# Patient Record
Sex: Male | Born: 1980 | Race: White | Hispanic: No | Marital: Married | Smoking: Never smoker
Health system: Southern US, Community
[De-identification: ages and names within clinical notes are randomized; demographics above are authoritative.]

## PROBLEM LIST (undated history)

## (undated) DIAGNOSIS — K219 Gastro-esophageal reflux disease without esophagitis: Secondary | ICD-10-CM

## (undated) HISTORY — DX: Gastro-esophageal reflux disease without esophagitis: K21.9

## (undated) HISTORY — PX: OTHER SURGICAL HISTORY: SHX169

## (undated) HISTORY — PX: UMBILECTOMY: SHX6675

---

## 2009-01-30 ENCOUNTER — Encounter: Admission: RE | Admit: 2009-01-30 | Discharge: 2009-01-30 | Payer: Self-pay | Admitting: Surgery

## 2009-03-07 ENCOUNTER — Ambulatory Visit (HOSPITAL_COMMUNITY): Admission: RE | Admit: 2009-03-07 | Discharge: 2009-03-07 | Payer: Self-pay | Admitting: Surgery

## 2010-03-03 ENCOUNTER — Encounter: Payer: Self-pay | Admitting: Surgery

## 2010-04-29 LAB — CBC
HCT: 44.6 % (ref 39.0–52.0)
MCHC: 34.5 g/dL (ref 30.0–36.0)
MCV: 86.9 fL (ref 78.0–100.0)
Platelets: 164 10*3/uL (ref 150–400)
WBC: 5.8 10*3/uL (ref 4.0–10.5)

## 2010-04-29 LAB — BASIC METABOLIC PANEL
CO2: 29 mEq/L (ref 19–32)
Calcium: 10 mg/dL (ref 8.4–10.5)
Chloride: 103 mEq/L (ref 96–112)
Creatinine, Ser: 1.02 mg/dL (ref 0.4–1.5)
Glucose, Bld: 81 mg/dL (ref 70–99)

## 2010-04-29 LAB — DIFFERENTIAL
Basophils Relative: 1 % (ref 0–1)
Eosinophils Absolute: 0.1 10*3/uL (ref 0.0–0.7)
Eosinophils Relative: 2 % (ref 0–5)
Lymphs Abs: 2.1 10*3/uL (ref 0.7–4.0)
Monocytes Relative: 11 % (ref 3–12)
Neutrophils Relative %: 51 % (ref 43–77)

## 2011-04-09 ENCOUNTER — Ambulatory Visit (INDEPENDENT_AMBULATORY_CARE_PROVIDER_SITE_OTHER): Payer: 59 | Admitting: Family Medicine

## 2011-04-09 ENCOUNTER — Encounter: Payer: Self-pay | Admitting: Family Medicine

## 2011-04-09 VITALS — BP 119/76 | HR 81 | Temp 99.2°F | Ht 71.0 in | Wt 222.0 lb

## 2011-04-09 DIAGNOSIS — M25519 Pain in unspecified shoulder: Secondary | ICD-10-CM

## 2011-04-09 DIAGNOSIS — M25511 Pain in right shoulder: Secondary | ICD-10-CM

## 2011-04-10 ENCOUNTER — Encounter: Payer: Self-pay | Admitting: Family Medicine

## 2011-04-10 DIAGNOSIS — M25511 Pain in right shoulder: Secondary | ICD-10-CM | POA: Insufficient documentation

## 2011-04-10 NOTE — Assessment & Plan Note (Signed)
overall his exam is reassuring.  Mild evidence of impingement and he's already gone through home exercise program.  We discussed conservative treatment with restarting HEP with nsaid, consider PT and/or cortisone injection.  He would like to go ahead with subacromial injection which was given today.  Advised to let me know if he wants to try PT.  Discussed icing, relative rest (especially next 5-7 days following injection).  F/u prn.  After informed written consent, patient was seated on exam table. Right shoulder was prepped with alcohol swab and utilizing posterior approach, patient's right subacromial space was injected with 3:1 marcaine: depomedrol. Patient tolerated the procedure well without immediate complications.

## 2011-04-10 NOTE — Progress Notes (Signed)
Subjective:    Patient ID: Angel Marshall, male    DOB: Jun 05, 1980, 31 y.o.   MRN: [id number]  PCP: Loleta Dicker  HPI 31 yo M here for right shoulder pain.  Patient initially started having problems with right shoulder about 8 months ago when playing basketball. Had right arm hit with it abducted, developed pain in right shoulder. This pain went away with time and home exercise program with theraband. Had negative apprehension, O'brien's tests at that time but positive impingement tests. Then reports over past couple months with throwing activities (is right handed) about 10 minutes in he develops pain in right shoulder. When he first throws or does overhead motion there's a rubber-band type feeling laterally in right shoulder that releases then no catching/popping after this. Recalls this restarting relatively soon after getting flu shot but does not feel this was given up near acromion. No night pain. No prior surgeries, injections of this shoulder. No left shoulder issues.  Past Medical History  Diagnosis Date  . GERD (gastroesophageal reflux disease)     No current outpatient prescriptions on file prior to visit.    History reviewed. No pertinent past surgical history.  No Known Allergies  History   Social History  . Marital Status: Married    Spouse Name: N/A    Number of Children: N/A  . Years of Education: N/A   Occupational History  . Not on file.   Social History Main Topics  . Smoking status: Never Smoker   . Smokeless tobacco: Not on file  . Alcohol Use: Not on file  . Drug Use: Not on file  . Sexually Active: Not on file   Other Topics Concern  . Not on file   Social History Narrative  . No narrative on file    Family History  Problem Relation Age of Onset  . Hyperlipidemia Mother   . Hyperlipidemia Father   . Diabetes Neg Hx   . Heart attack Neg Hx   . Hypertension Neg Hx   . Sudden death Neg Hx     BP 119/76  Pulse 81  Temp(Src) 99.2  F (37.3 C) (Oral)  Ht 5\' 11"  (1.803 m)  Wt 222 lb (100.699 kg)  BMI 30.96 kg/m2  Review of Systems See HPI above.    Objective:   Physical Exam Gen: NAD  R shoulder: No swelling, ecchymoses.  No gross deformity. No TTP AC or biceps tendon. FROM with mild painful arc. "Feels it" with hawkins but minimal pain.  Negative Neers. Negative Speeds, Yergasons. Negative crossover adduction. Negative o'briens. Negative Empty can and resisted internal/external rotation with 5/5 strength. Negative apprehension. NV intact distally.    Assessment & Plan:  1. Right shoulder pain - overall his exam is reassuring.  Mild evidence of impingement and he's already gone through home exercise program.  We discussed conservative treatment with restarting HEP with nsaid, consider PT and/or cortisone injection.  He would like to go ahead with subacromial injection which was given today.  Advised to let me know if he wants to try PT.  Discussed icing, relative rest (especially next 5-7 days following injection).  F/u prn.  Note: I do not think his receiving flu shot contributed to this based on where he states the injection site was - have seen some people with subacromial bursitis following flu shot but this occurred when flu shot site was just under acromion ([name] was not in this location).  After informed written consent, patient was  seated on exam table. Right shoulder was prepped with alcohol swab and utilizing posterior approach, patient's right subacromial space was injected with 3:1 marcaine: depomedrol. Patient tolerated the procedure well without immediate complications.

## 2011-12-17 ENCOUNTER — Ambulatory Visit (INDEPENDENT_AMBULATORY_CARE_PROVIDER_SITE_OTHER): Payer: 59 | Admitting: Family Medicine

## 2011-12-17 VITALS — BP 134/81 | Ht 71.0 in | Wt 207.0 lb

## 2011-12-17 DIAGNOSIS — M25519 Pain in unspecified shoulder: Secondary | ICD-10-CM

## 2011-12-17 DIAGNOSIS — M25511 Pain in right shoulder: Secondary | ICD-10-CM

## 2011-12-18 ENCOUNTER — Encounter: Payer: Self-pay | Admitting: Family Medicine

## 2011-12-18 NOTE — Assessment & Plan Note (Signed)
overall his exam is still reassuring.  However, not improving as expected with rest, home exercise program, subacromial injection.  No prior dislocation/subluxation.  Getting a click with throwing motions and pain with repetitive motions.  Advised we go ahead with MRI to further assess for rotator cuff pathology, possible labral pathology (less likely).  Will call him with results and how to proceed.

## 2011-12-18 NOTE — Progress Notes (Addendum)
Subjective:    Patient ID: Angel Marshall, male    DOB: [birth year], 31 y.o.   MRN: [id number]  PCP: Loleta Dicker  Shoulder Pain    31 yo M here for f/u right shoulder pain.  2/27: Patient initially started having problems with right shoulder about 8 months ago when playing basketball. Had right arm hit with it abducted, developed pain in right shoulder. This pain went away with time and home exercise program with theraband. Had negative apprehension, O'brien's tests at that time but positive impingement tests. Then reports over past couple months with throwing activities (is right handed) about 10 minutes in he develops pain in right shoulder. When he first throws or does overhead motion there's a rubber-band type feeling laterally in right shoulder that releases then no catching/popping after this. Recalls this restarting relatively soon after getting flu shot but does not feel this was given up near acromion. No night pain. No prior surgeries, injections of this shoulder. No left shoulder issues.  11/6: Patient reports despite rest, home exercise program, injection, has continued to struggle with right shoulder pain. Does improve with rest but when he goes to throw or cock arm back he feels a catching sensation deep in right shoulder. Pain comes on with repeated motions. Getting some pain in biceps tendon area as well. Improves with compression. 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 Improved with ergonomic issues. No radiation into arms. No numbness/tingling. No neck injuries. No bowel/bladder dysfunction.  Past Medical History  Diagnosis Date  . GERD (gastroesophageal reflux disease)     Current Outpatient Prescriptions on File Prior to Visit  Medication Sig Dispense Refill  . omeprazole (PRILOSEC) 10 MG capsule Take 10 mg by mouth daily.        History reviewed. No pertinent past surgical history.  No  Known Allergies  History   Social History  . Marital Status: Married    Spouse Name: N/A    Number of Children: N/A  . Years of Education: N/A   Occupational History  . Not on file.   Social History Main Topics  . Smoking status: Never Smoker   . Smokeless tobacco: Not on file  . Alcohol Use: Not on file  . Drug Use: Not on file  . Sexually Active: Not on file   Other Topics Concern  . Not on file   Social History Narrative  . No narrative on file    Family History  Problem Relation Age of Onset  . Hyperlipidemia Mother   . Hyperlipidemia Father   . Diabetes Neg Hx   . Heart attack Neg Hx   . Hypertension Neg Hx   . Sudden death Neg Hx     BP 134/81  Ht 5\' 11"  (1.803 m)  Wt 207 lb (93.895 kg)  BMI 28.87 kg/m2  Review of Systems  See HPI above.    Objective:   Physical Exam  Gen: NAD  Neck: No gross deformity, swelling, bruising. Mild TTP paraspinal muscles R > L.  No bony TTP. FROM neck - pain mild with extension and lateral rotations. BUE strength 5/5. Sensation intact to light touch.   2+ equal reflexes in biceps, trace triceps & brachioradialis tendons reflexes, equal bilaterally. Negative spurlings. NV intact distal BUEs.  R shoulder: No swelling, ecchymoses.  No gross deformity. TTP biceps tendon area.  No AC, other TTP. FROM with negative painful arc. "Feels it"  with hawkins but minimal pain.  Negative Neers. Negative Speeds, Yergasons. Negative crossover adduction. Negative o'briens. Negative Empty can and resisted internal/external rotation with 5/5 strength. Negative apprehension and sulcus. NV intact distally.    Assessment & Plan:  1. Right shoulder pain - overall his exam is still reassuring.  However, not improving as expected with rest, home exercise program, subacromial injection.  No prior dislocation/subluxation.  Getting a click with throwing motions and pain with repetitive motions.  Advised we go ahead with MRI to further  assess for rotator cuff pathology, possible labral pathology (less likely).  Will call him with results and how to proceed.  Addendum 1/8:  MRI results reviewed and discussed with patient.  Very reassuring - mild rotator cuff tendinopathy without tear and minimal subacromial bursitis.  Advised HEP, PT, and/or nitro patches.  He would like to continue with home exercise program for now.  Addendum 1/14:  Patient is going to speak with one of the PTs for a more extensive home exercise program, consider formal PT.  He also noted he went for a massage to right scapular, shoulder areas and this did help.  I think it's reasonable that he try massage as part of his treatment program over the next 2-3 months so will write for that as well.  Addendum 3/12:  Patient would like to try voltaren gel - script sent in to his pharmacy.

## 2011-12-20 ENCOUNTER — Ambulatory Visit (HOSPITAL_BASED_OUTPATIENT_CLINIC_OR_DEPARTMENT_OTHER): Payer: 59

## 2012-02-17 ENCOUNTER — Ambulatory Visit (HOSPITAL_BASED_OUTPATIENT_CLINIC_OR_DEPARTMENT_OTHER)
Admission: RE | Admit: 2012-02-17 | Discharge: 2012-02-17 | Disposition: A | Discharge status: Home / Self Care | Payer: 59 | Source: Ambulatory Visit | Attending: Family Medicine | Admitting: Family Medicine

## 2012-02-17 DIAGNOSIS — M25511 Pain in right shoulder: Secondary | ICD-10-CM

## 2012-02-17 DIAGNOSIS — M719 Bursopathy, unspecified: Secondary | ICD-10-CM | POA: Insufficient documentation

## 2012-02-17 DIAGNOSIS — M67919 Unspecified disorder of synovium and tendon, unspecified shoulder: Secondary | ICD-10-CM | POA: Insufficient documentation

## 2012-04-21 MED ORDER — DICLOFENAC SODIUM 1 % TD GEL: 2.0000 g | Freq: Four times a day (QID) | TRANSDERMAL | Status: DC

## 2012-04-21 NOTE — Addendum Note (Signed)
Addended by: Lenda Kelp on: 04/21/2012 08:44 AM   Modules accepted: Orders

## 2013-06-27 ENCOUNTER — Other Ambulatory Visit: Payer: Self-pay | Admitting: Family Medicine

## 2013-06-27 ENCOUNTER — Ambulatory Visit (INDEPENDENT_AMBULATORY_CARE_PROVIDER_SITE_OTHER): Payer: 59

## 2013-06-27 DIAGNOSIS — IMO0002 Reserved for concepts with insufficient information to code with codable children: Secondary | ICD-10-CM

## 2013-06-27 DIAGNOSIS — R229 Localized swelling, mass and lump, unspecified: Principal | ICD-10-CM

## 2013-06-27 DIAGNOSIS — N508 Other specified disorders of male genital organs: Secondary | ICD-10-CM

## 2014-02-09 ENCOUNTER — Telehealth: Payer: Self-pay | Admitting: Family Medicine

## 2014-02-09 NOTE — Telephone Encounter (Signed)
If this is the same issue with his shoulder, it's ok to just refer him to physical therapy.  If it's a different issue we should see him.

## 2014-02-17 ENCOUNTER — Other Ambulatory Visit: Payer: Self-pay | Admitting: *Deleted

## 2014-02-17 DIAGNOSIS — M25511 Pain in right shoulder: Secondary | ICD-10-CM

## 2014-02-27 ENCOUNTER — Ambulatory Visit: Payer: 59 | Admitting: Rehabilitation

## 2014-03-09 ENCOUNTER — Ambulatory Visit: Payer: 59 | Attending: Family Medicine | Admitting: Rehabilitation

## 2014-03-09 DIAGNOSIS — M25511 Pain in right shoulder: Secondary | ICD-10-CM | POA: Insufficient documentation

## 2014-03-20 ENCOUNTER — Ambulatory Visit: Payer: 59 | Attending: Family Medicine | Admitting: Rehabilitation

## 2014-03-20 DIAGNOSIS — M25511 Pain in right shoulder: Secondary | ICD-10-CM | POA: Diagnosis not present

## 2014-03-27 ENCOUNTER — Ambulatory Visit: Payer: 59 | Admitting: Rehabilitation

## 2014-04-03 ENCOUNTER — Encounter: Payer: Self-pay | Admitting: Rehabilitation

## 2014-04-03 ENCOUNTER — Ambulatory Visit: Payer: 59 | Admitting: Rehabilitation

## 2014-04-03 ENCOUNTER — Other Ambulatory Visit: Payer: Self-pay | Admitting: *Deleted

## 2014-04-03 DIAGNOSIS — M545 Low back pain, unspecified: Secondary | ICD-10-CM

## 2014-04-03 DIAGNOSIS — M25511 Pain in right shoulder: Secondary | ICD-10-CM | POA: Diagnosis not present

## 2014-04-03 NOTE — Patient Instructions (Signed)
HEP2go handout including: figure 4 stretch, piriformis stretch, ITB stretch with strap, ITB foam rolling, and open book rotations

## 2014-04-03 NOTE — Therapy (Signed)
[facility] [place] [address]  [place], [place], [address] Phone: [phone]   Fax:  [phone]  Physical Therapy Treatment  Patient Details  Name: Angel Marshall MRN: [id number] Date of Birth: 17-Nov-1980 Referring Provider:  Dene Gentry, MD  Encounter Date: 04/03/2014      PT End of Session - 04/03/14 1539    Visit Number 4   Number of Visits 6   Date for PT Re-Evaluation 04/20/14   PT Start Time 1400   PT Stop Time 1450   PT Time Calculation (min) 50 min      Past Medical History  Diagnosis Date  . GERD (gastroesophageal reflux disease)     History reviewed. No pertinent past surgical history.  There were no vitals taken for this visit.  Visit Diagnosis:  Left-sided low back pain without sciatica      Subjective Assessment - 04/03/14 1403    Symptoms Pt has no significant complaints of pain in the shoulder.  Is noticing more shoulder popping lately.  Would like to discuss low back and hamstring issues.  Pt has c/o intermittent low back spasms every 6-8 months.  Will occur with work during forward flexion and after getting fatigued after basketball.  Reports lately some feelings of L sided PSIS region. Feels this every day with motion. Does seem to be related to rotation during disc golf and after hamstrings are fatigued. Not really a pain   Pertinent History none   Diagnostic tests none   Patient Stated Goals pt would like to improve core stability if possible   Currently in Pain? Yes   Pain Score 2    Pain Location Other (Comment)  L PSIS region    Pain Frequency Intermittent   Aggravating Factors  rotation, hamstring fatigue   Pain Relieving Factors heat, foam roll, ice massage          [facility] PT Assessment - 04/03/14 0001    Assessment   Medical Diagnosis low back pain   Onset Date 02/11/11   Next MD Visit none   Prior Therapy no   Precautions   Precautions None   Restrictions    Weight Bearing Restrictions No   Balance Screen   Has the patient fallen in the past 6 months No   Prior Function   Level of Independence Independent with basic ADLs   Observation/Other Assessments   Focus on Therapeutic Outcomes (FOTO)  20%   AROM   Overall AROM Comments lumbar AROM WNL with tightness evident in lumbar rotation bilaterally   PROM   Overall PROM Comments PROM decreased KTOS, hip IR and hip ER bilaterally at >50%   Strength   Overall Strength Comments all LE strength testing normal. Extensor trunk strength excellent. trunk flexors at 46sec. B side planks normal   Flexibility   Soft Tissue Assessment /Muscle Length yes  decreased: KTOS positioning   Hamstrings 90 bilaterally   ITB moderately tight B   Piriformis significant tightness B   Palpation   Palpation no ttp; CPAs of lumbar spine normal   other   Comments SL stance, squat, and SL squat WNL, no change in pain with instability prone testing     Treatment:  Performed and demonstrated HEP:  Figure 4 stretch, R ITB stretch, R ITB with strap stretch R 2x20" all with vcs and tcs for performance.   Open book bilaterally 10"x2  PT Education - 04/03/14 1538    Education provided Yes   Education Details HEP   Person(s) Educated Patient   Methods Explanation;Demonstration;Handout   Comprehension Verbalized understanding;Returned demonstration             PT Long Term Goals - 04/03/14 1541    PT LONG TERM GOAL #1   Title be independent with advanced HEP for shoulder and core  3/10   Time 6   Period Weeks   Status Partially Met               Plan - 04/03/14 1539    Clinical Impression Statement Pt presents with symptoms consistent with lumbar pain and feelings of instability especially with lumbar rotation due to significant hip tightness in the internal and external rotators, ITB, and hip flexors, as well as the need for general core strength and endurance work.     PT Frequency 1x / week   PT Duration 6 weeks   PT Next Visit Plan core strengthening therapeutic exercise        Problem List Patient Active Problem List   Diagnosis Date Noted  . Right shoulder pain 04/10/2011    Tevis, Kara R, DPT 04/03/2014, 3:45 PM  [facility] [place] [address]  [address] [place], [place], [address] Phone: [phone]   Fax:  [phone]      

## 2014-04-19 ENCOUNTER — Ambulatory Visit: Payer: 59 | Admitting: Rehabilitation

## 2014-04-24 ENCOUNTER — Ambulatory Visit: Payer: 59 | Attending: Family Medicine | Admitting: Physical Therapy

## 2014-04-24 DIAGNOSIS — M25511 Pain in right shoulder: Secondary | ICD-10-CM | POA: Insufficient documentation

## 2014-04-24 DIAGNOSIS — M79631 Pain in right forearm: Secondary | ICD-10-CM

## 2014-04-24 NOTE — Therapy (Addendum)
[facility] [place] [address]  [place], [place], [address] Phone: [phone]   Fax:  [phone]  Physical Therapy Treatment  Patient Details  Name: Angel Marshall MRN: [id number] Date of Birth: 25-Dec-1980 Referring Provider:  Dene Gentry, MD  Encounter Date: 04/24/2014      PT End of Session - 04/24/14 1634    Visit Number 5   Number of Visits 6   Date for PT Re-Evaluation 05/01/14   PT Start Time 1450   PT Stop Time 1530   PT Time Calculation (min) 40 min      Past Medical History  Diagnosis Date  . GERD (gastroesophageal reflux disease)     No past surgical history on file.  There were no vitals filed for this visit.  Visit Diagnosis:  Right forearm pain      Subjective Assessment - 04/24/14 1455    Symptoms pt states LBP much better and stretching his hips has been helping.  States R shoulder also doing well but notes recent onset of R forearm pain.  States has had this in the past and required prolonged rest period to abolish this a couple years ago.   Pain Score --  1/10 at rest, 8/10 with disc golf and hammer curls   Pain Location --  R forearm in area of brachioradialus   Multiple Pain Sites No                   TODAY'S TREATMENT:      Trigger Point Dry Needling - 04/24/14 1640    Consent Given? Yes   Muscles Treated Upper Body --  R Brachioradialis and Extensor Digitorm    noted mild twitch response and reproduction of ache pain during treatment.  Iontophoresis patch with 63m/min Dex to R forearm.  Pt advised to remove at 1930 or earlier if uncomfortable.               PT Long Term Goals - 04/03/14 1541    PT LONG TERM GOAL #1   Title be independent with advanced HEP for shoulder and core  3/10   Time 6   Period Weeks   Status Partially Met               Plan - 04/24/14 1635    Clinical Impression Statement pt progressing well with LBP  POC.  Discussed stretching, yoga, and resistance training regarding LBP and pt with good understanding of progression at this point.  Pt with recent onset R forearm pain which seems muscle rather than tendon in origin and is area of bracihoradialis.  Treatment today focused on this area.   Pt will benefit from skilled therapeutic intervention in order to improve on the following deficits Pain   Rehab Potential Good   PT Treatment/Interventions Dry needling;Ultrasound;Therapeutic activities;Therapeutic exercise;Patient/family education;Moist Heat;Manual techniques;Electrical Stimulation;Other (comment)  iontophoresis with 871mmin dex   PT Next Visit Plan re-assess, possible tape and/or ionto   Consulted and Agree with Plan of Care Patient        Problem List Patient Active Problem List   Diagnosis Date Noted  . Right shoulder pain 04/10/2011    [facility], OCS 04/24/2014, 4:46 PM  [facility]one: [phone] Fax:  [phone]   PHYSICAL THERAPY DISCHARGE SUMMARY  Visits from Start of Care: 5  Current  functional level related to goals / functional outcomes: Nearly full to full function   Remaining deficits: unknown   Education / Equipment: HEP and Dietitian Plan: Patient agrees to discharge.  Patient goals were partially met. Patient is being discharged due to not returning since the last visit.  ?????     Angel Marshall was seen due to LBP and was also treated briefly for R Shoulder and Forearm pain.  Overall seemed to progress well and did not require much assistance once his back improved.  He was consistent with HEP and was able to return to prior gym workouts and disc golf.  He is being discharged due to not returning since last treatment.  [name] PT, OCS 06/13/2014 2:57 PM

## 2015-02-26 ENCOUNTER — Other Ambulatory Visit: Payer: Self-pay | Admitting: Emergency Medicine

## 2015-02-26 DIAGNOSIS — E291 Testicular hypofunction: Secondary | ICD-10-CM

## 2015-03-02 ENCOUNTER — Other Ambulatory Visit (INDEPENDENT_AMBULATORY_CARE_PROVIDER_SITE_OTHER): Payer: 59

## 2015-03-02 DIAGNOSIS — E291 Testicular hypofunction: Secondary | ICD-10-CM | POA: Diagnosis not present

## 2015-03-02 LAB — TESTOSTERONE: TESTOSTERONE: 420 ng/dL (ref 250–827)

## 2015-03-09 LAB — ESTRADIOL, FREE
ESTRADIOL FREE: 0.5 pg/mL — AB (ref <=0.45)
Estradiol: 26 pg/mL (ref <=29)

## 2015-05-24 ENCOUNTER — Other Ambulatory Visit: Payer: Self-pay | Admitting: Family Medicine

## 2015-05-24 ENCOUNTER — Other Ambulatory Visit (INDEPENDENT_AMBULATORY_CARE_PROVIDER_SITE_OTHER): Payer: 59

## 2015-05-24 DIAGNOSIS — E291 Testicular hypofunction: Secondary | ICD-10-CM

## 2015-05-25 LAB — ESTRADIOL: ESTRADIOL: 43 pg/mL — AB (ref <=39)

## 2015-05-29 LAB — TESTOS,TOTAL,FREE AND SHBG (FEMALE)
SEX HORMONE BINDING GLOB.: 35 nmol/L (ref 10–50)
TESTOSTERONE,FREE: 121.9 pg/mL (ref 35.0–155.0)
Testosterone,Total,LC/MS/MS: 739 ng/dL (ref 250–1100)

## 2015-07-18 ENCOUNTER — Encounter: Payer: Self-pay | Admitting: Family Medicine

## 2015-07-18 ENCOUNTER — Ambulatory Visit (INDEPENDENT_AMBULATORY_CARE_PROVIDER_SITE_OTHER): Payer: 59 | Admitting: Family Medicine

## 2015-07-18 VITALS — BP 127/81 | HR 71 | Temp 98.0°F | Ht 70.0 in | Wt 198.2 lb

## 2015-07-18 DIAGNOSIS — L03012 Cellulitis of left finger: Secondary | ICD-10-CM

## 2015-07-18 MED ORDER — AMOXICILLIN-POT CLAVULANATE 875-125 MG PO TABS: 1.0000 | ORAL_TABLET | Freq: Two times a day (BID) | ORAL | Status: DC

## 2015-07-18 NOTE — Progress Notes (Signed)
Pre visit review using our clinic review tool, if applicable. No additional management support is needed unless otherwise documented below in the visit note. 

## 2015-07-18 NOTE — Progress Notes (Signed)
[place] [facility] [address], [place],  [address] [phone]  Date:  07/18/2015   Name:  Angel Marshall   DOB:  [birth year]   MRN:  [id number]  PCP:  Amador Cunas, FNP    Chief Complaint: Swollen Finger   History of Present Illness:  Angel Marshall is a 35 y.o. 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  He noted possible infection at the base of the nail about 3 days ago; thought it might have some from a hang-nail.  Yesterday he tried to lance it and did not get any pus, tried again today but only got some clear fluid and a little bit of blood.  The nail is quite tender He has otherwise felt well, no fever or chills He is generally in good health   Patient Active Problem List   Diagnosis Date Noted  . Right shoulder pain 04/10/2011    Past Medical History  Diagnosis Date  . GERD (gastroesophageal reflux disease)     No past surgical history on file.  Social History  Substance Use Topics  . Smoking status: Never Smoker   . Smokeless tobacco: None  . Alcohol Use: None    Family History  Problem Relation Age of Onset  . Hyperlipidemia Mother   . Hyperlipidemia Father   . Diabetes Neg Hx   . Heart attack Neg Hx   . Hypertension Neg Hx   . Sudden death Neg Hx     No Known Allergies  Medication list has been reviewed and updated.  No current outpatient prescriptions on file prior to visit.   No current facility-administered medications on file prior to visit.    Review of Systems:  As per HPI- otherwise negative.   Physical Examination: There were no vitals filed for this visit. Filed Vitals:   07/18/15 1354  Height: 5\' 10"  (1.778 m)  Weight: 198 lb 3.2 oz (89.903 kg)   Body mass index is 28.44 kg/(m^2). Ideal Body Weight: Weight in (lb) to have BMI = 25:  173.9   GEN: WDWN, NAD, Non-toxic, Alert & Oriented x 3 HEENT: Atraumatic, Normocephalic.  Ears and Nose: No external deformity. EXTR: No clubbing/cyanosis/edema NEURO: Normal gait.  PSYCH: Normally interactive. Conversant. Not depressed or anxious appearing.  Calm demeanor.  Left index finger: there is a red, swollen area with an area of maceration at the base of the nail.   VC obtained; prepped finger and used a small wheal of lidocaine for anesthesia. I andD with 11 blade and evacuated a small hematoma but no pus  Assessment and Plan: Paronychia of finger, left - Plan: amoxicillin-clavulanate (AUGMENTIN) 875-125 MG tablet  Here today with paronychia- I and D relieved hematoma but not pus. Will start on augmentin.  He will let me know if not improved in the next few days- Sooner if worse.      Signed Lamar Blinks, MD

## 2015-07-18 NOTE — Patient Instructions (Signed)
Use the augmentin as directed for your infected finger.  Please let me know if it is not getting better in the next couple of days-.Sooner if worse.   You can soak the finger and/ or apply neosporin if you like.   Send me a staff if not improving!

## 2015-08-03 ENCOUNTER — Other Ambulatory Visit: Payer: Self-pay | Admitting: Family Medicine

## 2015-08-03 ENCOUNTER — Other Ambulatory Visit (INDEPENDENT_AMBULATORY_CARE_PROVIDER_SITE_OTHER): Payer: 59

## 2015-08-03 DIAGNOSIS — M609 Myositis, unspecified: Secondary | ICD-10-CM

## 2015-08-03 DIAGNOSIS — R0602 Shortness of breath: Secondary | ICD-10-CM

## 2015-08-03 DIAGNOSIS — IMO0001 Reserved for inherently not codable concepts without codable children: Secondary | ICD-10-CM

## 2015-08-03 DIAGNOSIS — E349 Endocrine disorder, unspecified: Secondary | ICD-10-CM

## 2015-08-03 DIAGNOSIS — R5383 Other fatigue: Secondary | ICD-10-CM | POA: Diagnosis not present

## 2015-08-03 DIAGNOSIS — K219 Gastro-esophageal reflux disease without esophagitis: Secondary | ICD-10-CM | POA: Diagnosis not present

## 2015-08-03 DIAGNOSIS — R12 Heartburn: Secondary | ICD-10-CM

## 2015-08-03 DIAGNOSIS — E348 Other specified endocrine disorders: Secondary | ICD-10-CM | POA: Diagnosis not present

## 2015-08-03 DIAGNOSIS — M791 Myalgia: Secondary | ICD-10-CM | POA: Diagnosis not present

## 2015-08-03 LAB — COMPREHENSIVE METABOLIC PANEL
ALBUMIN: 5 g/dL (ref 3.5–5.2)
ALK PHOS: 54 U/L (ref 39–117)
ALT: 36 U/L (ref 0–53)
AST: 29 U/L (ref 0–37)
BUN: 18 mg/dL (ref 6–23)
CHLORIDE: 102 meq/L (ref 96–112)
CO2: 30 mEq/L (ref 19–32)
CREATININE: 1.14 mg/dL (ref 0.40–1.50)
Calcium: 10.4 mg/dL (ref 8.4–10.5)
GFR: 77.43 mL/min (ref >60.00)
GLUCOSE: 85 mg/dL (ref 70–99)
POTASSIUM: 3.8 meq/L (ref 3.5–5.1)
SODIUM: 138 meq/L (ref 135–145)
TOTAL PROTEIN: 8 g/dL (ref 6.0–8.3)
Total Bilirubin: 0.8 mg/dL (ref 0.2–1.2)

## 2015-08-03 LAB — LIPID PANEL
CHOLESTEROL: 252 mg/dL — AB (ref 0–200)
HDL: 54.9 mg/dL (ref >39.00)
LDL Cholesterol: 185 mg/dL — ABNORMAL HIGH (ref 0–99)
NONHDL: 196.81
TRIGLYCERIDES: 60 mg/dL (ref 0.0–149.0)
Total CHOL/HDL Ratio: 5
VLDL: 12 mg/dL (ref 0.0–40.0)

## 2015-08-03 LAB — MAGNESIUM: MAGNESIUM: 2 mg/dL (ref 1.5–2.5)

## 2015-08-03 LAB — VITAMIN D 25 HYDROXY (VIT D DEFICIENCY, FRACTURES): VITD: 78.76 ng/mL (ref 30.00–100.00)

## 2015-08-03 LAB — SEDIMENTATION RATE: Sed Rate: 3 mm/hr (ref 0–15)

## 2015-08-03 LAB — HEMOGLOBIN A1C: Hgb A1c MFr Bld: 5.3 % (ref 4.6–6.5)

## 2015-08-03 LAB — TSH: TSH: 2.14 u[IU]/mL (ref 0.35–4.50)

## 2015-08-03 NOTE — Addendum Note (Signed)
Addended by: Caffie Pinto on: 08/03/2015 10:47 AM   Modules accepted: Orders

## 2015-08-06 ENCOUNTER — Encounter: Payer: Self-pay | Admitting: Family Medicine

## 2015-08-06 ENCOUNTER — Ambulatory Visit (INDEPENDENT_AMBULATORY_CARE_PROVIDER_SITE_OTHER): Payer: 59 | Admitting: Family Medicine

## 2015-08-06 VITALS — BP 123/84 | HR 83 | Ht 70.0 in

## 2015-08-06 DIAGNOSIS — M25562 Pain in left knee: Secondary | ICD-10-CM | POA: Diagnosis not present

## 2015-08-06 LAB — CYCLIC CITRUL PEPTIDE ANTIBODY, IGG: Cyclic Citrullin Peptide Ab: <16 Units

## 2015-08-06 NOTE — Patient Instructions (Signed)
You have a traumatic synovitis. Ice 15 minutes at a time 3-4 times a day while swollen. Elevate above your heart as much as possible. Ibuprofen or aleve for 7-10 days then as needed. ACE wrap when up and walking around for 1-2 weeks or until swelling has gone away. Basic knee extensions, straight leg raises 3 sets of 10 once a day. Let me know how you're doing over next 1-2 weeks.

## 2015-08-08 DIAGNOSIS — M25562 Pain in left knee: Secondary | ICD-10-CM | POA: Insufficient documentation

## 2015-08-08 LAB — TESTOS,TOTAL,FREE AND SHBG (FEMALE)
Sex Hormone Binding Glob.: 41 nmol/L (ref 10–50)
Testosterone, Free: 42.5 pg/mL (ref 35.0–155.0)
Testosterone,Total,LC/MS/MS: 283 ng/dL (ref 250–1100)

## 2015-08-08 NOTE — Progress Notes (Signed)
PCP: Amador Cunas, FNP  Subjective:   HPI: Patient is a 35 y.o. male here for left knee pain.  Patient reports on Saturday he was playing in a [employer]. No injuries during this. Recalls turning to get out of a car and felt weird in his left knee. Tightness, swelling, throbbing especially later that night. Tightness into his calf also. Pain has improved a lot over past 24 hours - now at most 2/10, dull and anterior. Still swollen. No prior knee issues. No numbness, skin changes.  Past Medical History  Diagnosis Date  . GERD (gastroesophageal reflux disease)     Current Outpatient Prescriptions on File Prior to Visit  Medication Sig Dispense Refill  . amoxicillin-clavulanate (AUGMENTIN) 875-125 MG tablet Take 1 tablet by mouth 2 (two) times daily. 20 tablet 0  . Cholecalciferol (VITAMIN D3) 5000 units CAPS Take 1 capsule by mouth daily.    . meloxicam (MOBIC) 15 MG tablet Take 1 tablet by mouth daily as needed.  0  . Multiple Vitamin (MULTIVITAMIN) tablet Take 1 tablet by mouth daily.    . naproxen (NAPROSYN) 500 MG tablet Take 1 tablet by mouth 2 (two) times daily.  0  . ranitidine (ZANTAC) 150 MG capsule Take 150 mg by mouth 2 (two) times daily.    . SUDOGEST 30 MG tablet Take 1-2 tablets every 4-6 hours as needed  0   No current facility-administered medications on file prior to visit.    No past surgical history on file.  No Known Allergies  Social History   Social History  . Marital Status: Married    Spouse Name: N/A  . Number of Children: N/A  . Years of Education: N/A   Occupational History  . Not on file.   Social History Main Topics  . Smoking status: Never Smoker   . Smokeless tobacco: Not on file  . Alcohol Use: Not on file  . Drug Use: Not on file  . Sexual Activity: Not on file   Other Topics Concern  . Not on file   Social History Narrative    Family History  Problem Relation Age of Onset  . Hyperlipidemia Mother   . Hyperlipidemia  Father   . Diabetes Neg Hx   . Heart attack Neg Hx   . Hypertension Neg Hx   . Sudden death Neg Hx     BP 123/84 mmHg  Pulse 83  Ht 5\' 10"  (1.778 m)  Review of Systems: See HPI above.    Objective:  Physical Exam:  Gen: NAD, comfortable in exam room  Left knee: Mod effusion.  No bruising, other deformity. No TTP. FROM. Negative ant/post drawers. Negative valgus/varus testing. Negative lachmanns. Negative mcmurrays, apleys, thessalys, sit home, patellar apprehension. NV intact distally.    Assessment & Plan:  1. Left knee pain - most of pain has resolved and his exam is completely benign with the exception of effusion.  No acute injury.  Consistent with a traumatic synovitis.  Icing, elevation, compression, nsaids for next 7-10 days.  Shown home exercises to do daily.  Advised to let us know how he's doing over next 1-2 weeks.

## 2015-08-08 NOTE — Assessment & Plan Note (Signed)
most of pain has resolved and his exam is completely benign with the exception of effusion.  No acute injury.  Consistent with a traumatic synovitis.  Icing, elevation, compression, nsaids for next 7-10 days.  Shown home exercises to do daily.  Advised to let us know how he's doing over next 1-2 weeks.

## 2015-09-11 HISTORY — PX: APPENDECTOMY: SHX54

## 2015-09-24 ENCOUNTER — Observation Stay (HOSPITAL_COMMUNITY)
Admission: EM | Admit: 2015-09-24 | Discharge: 2015-09-26 | Disposition: A | Discharge status: Home / Self Care | Payer: 59 | Source: Home / Self Care | Attending: Emergency Medicine | Admitting: Emergency Medicine

## 2015-09-24 ENCOUNTER — Ambulatory Visit (HOSPITAL_BASED_OUTPATIENT_CLINIC_OR_DEPARTMENT_OTHER)
Admission: RE | Admit: 2015-09-24 | Discharge: 2015-09-24 | Disposition: A | Discharge status: Home / Self Care | Payer: 59 | Source: Ambulatory Visit | Attending: Sports Medicine | Admitting: Sports Medicine

## 2015-09-24 ENCOUNTER — Encounter (HOSPITAL_COMMUNITY): Payer: Self-pay | Admitting: Emergency Medicine

## 2015-09-24 ENCOUNTER — Other Ambulatory Visit: Payer: Self-pay | Admitting: Sports Medicine

## 2015-09-24 DIAGNOSIS — K353 Acute appendicitis with localized peritonitis, without perforation or gangrene: Secondary | ICD-10-CM

## 2015-09-24 DIAGNOSIS — R100 Acute abdomen: Secondary | ICD-10-CM | POA: Insufficient documentation

## 2015-09-24 DIAGNOSIS — R109 Unspecified abdominal pain: Secondary | ICD-10-CM

## 2015-09-24 DIAGNOSIS — K3580 Unspecified acute appendicitis: Secondary | ICD-10-CM | POA: Insufficient documentation

## 2015-09-24 DIAGNOSIS — Z9049 Acquired absence of other specified parts of digestive tract: Secondary | ICD-10-CM | POA: Diagnosis present

## 2015-09-24 LAB — CBC WITH DIFFERENTIAL/PLATELET
Basophils Absolute: 0 10*3/uL (ref 0.0–0.1)
Basophils Relative: 0 %
EOS ABS: 0.1 10*3/uL (ref 0.0–0.7)
EOS PCT: 1 %
HCT: 44.9 % (ref 39.0–52.0)
Hemoglobin: 14.8 g/dL (ref 13.0–17.0)
LYMPHS ABS: 1.9 10*3/uL (ref 0.7–4.0)
LYMPHS PCT: 13 %
MCH: 29.4 pg (ref 26.0–34.0)
MCHC: 33 g/dL (ref 30.0–36.0)
MCV: 89.3 fL (ref 78.0–100.0)
MONO ABS: 1.1 10*3/uL — AB (ref 0.1–1.0)
MONOS PCT: 7 %
Neutro Abs: 11.8 10*3/uL — ABNORMAL HIGH (ref 1.7–7.7)
Neutrophils Relative %: 79 %
PLATELETS: 198 10*3/uL (ref 150–400)
RBC: 5.03 MIL/uL (ref 4.22–5.81)
RDW: 13.1 % (ref 11.5–15.5)
WBC: 14.9 10*3/uL — AB (ref 4.0–10.5)

## 2015-09-24 LAB — URINALYSIS, ROUTINE W REFLEX MICROSCOPIC
BILIRUBIN URINE: NEGATIVE
GLUCOSE, UA: NEGATIVE mg/dL
HGB URINE DIPSTICK: NEGATIVE
Ketones, ur: 40 mg/dL — AB
Leukocytes, UA: NEGATIVE
Nitrite: NEGATIVE
PROTEIN: NEGATIVE mg/dL
Specific Gravity, Urine: 1.03 (ref 1.005–1.030)
pH: 7 (ref 5.0–8.0)

## 2015-09-24 MED ORDER — IOPAMIDOL (ISOVUE-300) INJECTION 61%
100.0000 mL | Freq: Once | INTRAVENOUS | Status: AC | PRN
  Administered 2015-09-24: 100 mL via INTRAVENOUS

## 2015-09-24 MED ORDER — IIOPAMIDOL (ISOVUE-250) INJECTION 51%: 100.0000 mL | Freq: Once | INTRAVENOUS | Status: DC | PRN

## 2015-09-24 NOTE — Progress Notes (Signed)
Patient with acute abdominal pain, RLQ, needs urgent CT to eval for appendicitis. Will follow up with PCP prn.

## 2015-09-24 NOTE — ED Triage Notes (Signed)
Charge nurse notified on pt.'s arrival / condition .

## 2015-09-24 NOTE — ED Triage Notes (Signed)
Pt. reports RLQ pain onset this morning , denies emesis or diarrhea , no fever or chills , seen at [facility] today -CT scan result shows acute appendicitis , transferred here for admission/surgery .

## 2015-09-25 ENCOUNTER — Observation Stay (HOSPITAL_COMMUNITY): Payer: 59 | Admitting: Anesthesiology

## 2015-09-25 ENCOUNTER — Encounter (HOSPITAL_COMMUNITY)
Admission: EM | Disposition: A | Discharge status: Home / Self Care | Payer: Self-pay | Source: Home / Self Care | Attending: Emergency Medicine

## 2015-09-25 ENCOUNTER — Encounter (HOSPITAL_COMMUNITY): Payer: Self-pay | Admitting: Neurology

## 2015-09-25 DIAGNOSIS — K219 Gastro-esophageal reflux disease without esophagitis: Secondary | ICD-10-CM | POA: Diagnosis not present

## 2015-09-25 DIAGNOSIS — Z9049 Acquired absence of other specified parts of digestive tract: Secondary | ICD-10-CM | POA: Diagnosis present

## 2015-09-25 DIAGNOSIS — K3580 Unspecified acute appendicitis: Secondary | ICD-10-CM | POA: Diagnosis not present

## 2015-09-25 DIAGNOSIS — R1031 Right lower quadrant pain: Secondary | ICD-10-CM | POA: Diagnosis not present

## 2015-09-25 DIAGNOSIS — R100 Acute abdomen: Secondary | ICD-10-CM | POA: Diagnosis not present

## 2015-09-25 DIAGNOSIS — K37 Unspecified appendicitis: Secondary | ICD-10-CM | POA: Diagnosis not present

## 2015-09-25 HISTORY — PX: LAPAROSCOPIC APPENDECTOMY: SHX408

## 2015-09-25 LAB — COMPREHENSIVE METABOLIC PANEL
ALT: 28 U/L (ref 17–63)
AST: 30 U/L (ref 15–41)
Albumin: 4.3 g/dL (ref 3.5–5.0)
Alkaline Phosphatase: 49 U/L (ref 38–126)
Anion gap: 8 (ref 5–15)
BILIRUBIN TOTAL: 1 mg/dL (ref 0.3–1.2)
BUN: 9 mg/dL (ref 6–20)
CHLORIDE: 102 mmol/L (ref 101–111)
CO2: 28 mmol/L (ref 22–32)
Calcium: 9.7 mg/dL (ref 8.9–10.3)
Creatinine, Ser: 1.18 mg/dL (ref 0.61–1.24)
Glucose, Bld: 107 mg/dL — ABNORMAL HIGH (ref 65–99)
POTASSIUM: 3.8 mmol/L (ref 3.5–5.1)
Sodium: 138 mmol/L (ref 135–145)
TOTAL PROTEIN: 7 g/dL (ref 6.5–8.1)

## 2015-09-25 LAB — SURGICAL PCR SCREEN
MRSA, PCR: NEGATIVE
STAPHYLOCOCCUS AUREUS: NEGATIVE

## 2015-09-25 SURGERY — APPENDECTOMY, LAPAROSCOPIC
Anesthesia: General | Site: Abdomen

## 2015-09-25 MED ORDER — LIDOCAINE 2% (20 MG/ML) 5 ML SYRINGE
INTRAMUSCULAR | Status: AC
  Filled 2015-09-25: qty 5

## 2015-09-25 MED ORDER — CHLORHEXIDINE GLUCONATE CLOTH 2 % EX PADS: 6.0000 | MEDICATED_PAD | Freq: Once | CUTANEOUS | Status: DC

## 2015-09-25 MED ORDER — 0.9 % SODIUM CHLORIDE (POUR BTL) OPTIME
TOPICAL | Status: DC | PRN
  Administered 2015-09-25: 1000 mL

## 2015-09-25 MED ORDER — FENTANYL CITRATE (PF) 250 MCG/5ML IJ SOLN
INTRAMUSCULAR | Status: AC
  Filled 2015-09-25: qty 5

## 2015-09-25 MED ORDER — HYDROMORPHONE HCL 1 MG/ML IJ SOLN: 1.0000 mg | INTRAMUSCULAR | Status: DC | PRN

## 2015-09-25 MED ORDER — SODIUM CHLORIDE 0.9 % IR SOLN
Status: DC | PRN
  Administered 2015-09-25: 1000 mL

## 2015-09-25 MED ORDER — SUGAMMADEX SODIUM 200 MG/2ML IV SOLN
INTRAVENOUS | Status: DC | PRN
  Administered 2015-09-25: 200 mg via INTRAVENOUS

## 2015-09-25 MED ORDER — HYDROMORPHONE HCL 1 MG/ML IJ SOLN: 0.2500 mg | INTRAMUSCULAR | Status: DC | PRN

## 2015-09-25 MED ORDER — PROPOFOL 10 MG/ML IV BOLUS
INTRAVENOUS | Status: DC | PRN
  Administered 2015-09-25: 200 mg via INTRAVENOUS

## 2015-09-25 MED ORDER — METRONIDAZOLE IN NACL 5-0.79 MG/ML-% IV SOLN
500.0000 mg | Freq: Three times a day (TID) | INTRAVENOUS | Status: DC
Start: 2015-09-25 — End: 2015-09-26
  Administered 2015-09-25 – 2015-09-26 (×5): 500 mg via INTRAVENOUS
  Filled 2015-09-25 (×7): qty 100

## 2015-09-25 MED ORDER — SUCCINYLCHOLINE CHLORIDE 200 MG/10ML IV SOSY
PREFILLED_SYRINGE | INTRAVENOUS | Status: AC
  Filled 2015-09-25: qty 10

## 2015-09-25 MED ORDER — LIDOCAINE HCL (CARDIAC) 20 MG/ML IV SOLN
INTRAVENOUS | Status: DC | PRN
  Administered 2015-09-25: 90 mg via INTRAVENOUS

## 2015-09-25 MED ORDER — PROMETHAZINE HCL 25 MG/ML IJ SOLN: 6.2500 mg | INTRAMUSCULAR | Status: DC | PRN

## 2015-09-25 MED ORDER — FENTANYL CITRATE (PF) 100 MCG/2ML IJ SOLN
INTRAMUSCULAR | Status: DC | PRN
  Administered 2015-09-25: 50 ug via INTRAVENOUS

## 2015-09-25 MED ORDER — OXYCODONE HCL 5 MG/5ML PO SOLN: 5.0000 mg | Freq: Once | ORAL | Status: AC | PRN

## 2015-09-25 MED ORDER — PHENYLEPHRINE 40 MCG/ML (10ML) SYRINGE FOR IV PUSH (FOR BLOOD PRESSURE SUPPORT)
PREFILLED_SYRINGE | INTRAVENOUS | Status: AC
  Filled 2015-09-25: qty 10

## 2015-09-25 MED ORDER — OXYCODONE HCL 5 MG PO TABS
ORAL_TABLET | ORAL | Status: AC
  Filled 2015-09-25: qty 1

## 2015-09-25 MED ORDER — BUPIVACAINE-EPINEPHRINE 0.5% -1:200000 IJ SOLN
INTRAMUSCULAR | Status: DC | PRN
  Administered 2015-09-25: 3 mL

## 2015-09-25 MED ORDER — ONDANSETRON 4 MG PO TBDP: 4.0000 mg | ORAL_TABLET | Freq: Four times a day (QID) | ORAL | Status: DC | PRN

## 2015-09-25 MED ORDER — DEXTROSE 5 % IV SOLN
2.0000 g | Freq: Every day | INTRAVENOUS | Status: DC
  Administered 2015-09-25 – 2015-09-26 (×2): 2 g via INTRAVENOUS
  Filled 2015-09-25 (×2): qty 2

## 2015-09-25 MED ORDER — PROPOFOL 10 MG/ML IV BOLUS
INTRAVENOUS | Status: AC
  Filled 2015-09-25: qty 20

## 2015-09-25 MED ORDER — SUGAMMADEX SODIUM 200 MG/2ML IV SOLN
INTRAVENOUS | Status: AC
  Filled 2015-09-25: qty 2

## 2015-09-25 MED ORDER — PHENYLEPHRINE HCL 10 MG/ML IJ SOLN
INTRAMUSCULAR | Status: DC | PRN
  Administered 2015-09-25 (×2): 80 ug via INTRAVENOUS

## 2015-09-25 MED ORDER — OXYCODONE-ACETAMINOPHEN 5-325 MG PO TABS: 1.0000 | ORAL_TABLET | ORAL | Status: DC | PRN

## 2015-09-25 MED ORDER — LACTATED RINGERS IV SOLN
INTRAVENOUS | Status: DC | PRN
  Administered 2015-09-25 (×2): via INTRAVENOUS

## 2015-09-25 MED ORDER — SUCCINYLCHOLINE CHLORIDE 20 MG/ML IJ SOLN
INTRAMUSCULAR | Status: DC | PRN
  Administered 2015-09-25: 90 mg via INTRAVENOUS

## 2015-09-25 MED ORDER — ONDANSETRON HCL 4 MG/2ML IJ SOLN: 4.0000 mg | Freq: Four times a day (QID) | INTRAMUSCULAR | Status: DC | PRN

## 2015-09-25 MED ORDER — BUPIVACAINE-EPINEPHRINE (PF) 0.5% -1:200000 IJ SOLN
INTRAMUSCULAR | Status: AC
  Filled 2015-09-25: qty 30

## 2015-09-25 MED ORDER — ROCURONIUM BROMIDE 100 MG/10ML IV SOLN
INTRAVENOUS | Status: DC | PRN
  Administered 2015-09-25: 30 mg via INTRAVENOUS
  Administered 2015-09-25: 10 mg via INTRAVENOUS

## 2015-09-25 MED ORDER — ONDANSETRON HCL 4 MG/2ML IJ SOLN
INTRAMUSCULAR | Status: DC | PRN
  Administered 2015-09-25: 4 mg via INTRAVENOUS

## 2015-09-25 MED ORDER — MIDAZOLAM HCL 2 MG/2ML IJ SOLN
INTRAMUSCULAR | Status: AC
  Filled 2015-09-25: qty 2

## 2015-09-25 MED ORDER — DEXTROSE-NACL 5-0.9 % IV SOLN
INTRAVENOUS | Status: DC
  Administered 2015-09-25 – 2015-09-26 (×3): via INTRAVENOUS

## 2015-09-25 MED ORDER — ROCURONIUM BROMIDE 10 MG/ML (PF) SYRINGE
PREFILLED_SYRINGE | INTRAVENOUS | Status: AC
  Filled 2015-09-25: qty 10

## 2015-09-25 MED ORDER — OXYCODONE HCL 5 MG PO TABS
5.0000 mg | ORAL_TABLET | Freq: Once | ORAL | Status: AC | PRN
  Administered 2015-09-25: 5 mg via ORAL

## 2015-09-25 MED ORDER — PHENYLEPHRINE HCL 10 MG/ML IJ SOLN
INTRAVENOUS | Status: DC | PRN
  Administered 2015-09-25: 50 ug/min via INTRAVENOUS

## 2015-09-25 MED ORDER — ONDANSETRON HCL 4 MG/2ML IJ SOLN
INTRAMUSCULAR | Status: AC
  Filled 2015-09-25: qty 2

## 2015-09-25 SURGICAL SUPPLY — 44 items
APPLIER CLIP ROT 10 11.4 M/L (STAPLE) ×3
BLADE SURG ROTATE 9660 (MISCELLANEOUS) IMPLANT
CANISTER SUCTION 2500CC (MISCELLANEOUS) ×3 IMPLANT
CHLORAPREP W/TINT 26ML (MISCELLANEOUS) ×3 IMPLANT
CLIP APPLIE ROT 10 11.4 M/L (STAPLE) ×1 IMPLANT
COVER SURGICAL LIGHT HANDLE (MISCELLANEOUS) ×3 IMPLANT
CUTTER FLEX LINEAR 45M (STAPLE) ×3 IMPLANT
DRAPE WARM FLUID 44X44 (DRAPE) ×3 IMPLANT
ELECT REM PT RETURN 9FT ADLT (ELECTROSURGICAL) ×3
ELECTRODE REM PT RTRN 9FT ADLT (ELECTROSURGICAL) ×1 IMPLANT
ENDOLOOP SUT PDS II  0 18 (SUTURE)
ENDOLOOP SUT PDS II 0 18 (SUTURE) IMPLANT
GLOVE BIO SURGEON STRL SZ8 (GLOVE) ×3 IMPLANT
GLOVE BIOGEL PI IND STRL 6.5 (GLOVE) ×1 IMPLANT
GLOVE BIOGEL PI IND STRL 7.0 (GLOVE) ×1 IMPLANT
GLOVE BIOGEL PI IND STRL 8 (GLOVE) ×1 IMPLANT
GLOVE BIOGEL PI INDICATOR 6.5 (GLOVE) ×2
GLOVE BIOGEL PI INDICATOR 7.0 (GLOVE) ×2
GLOVE BIOGEL PI INDICATOR 8 (GLOVE) ×2
GLOVE SURG SS PI 6.5 STRL IVOR (GLOVE) ×3 IMPLANT
GOWN STRL REUS W/ TWL LRG LVL3 (GOWN DISPOSABLE) ×2 IMPLANT
GOWN STRL REUS W/ TWL XL LVL3 (GOWN DISPOSABLE) ×1 IMPLANT
GOWN STRL REUS W/TWL LRG LVL3 (GOWN DISPOSABLE) ×4
GOWN STRL REUS W/TWL XL LVL3 (GOWN DISPOSABLE) ×2
KIT BASIN OR (CUSTOM PROCEDURE TRAY) ×3 IMPLANT
KIT ROOM TURNOVER OR (KITS) ×3 IMPLANT
LIQUID BAND (GAUZE/BANDAGES/DRESSINGS) ×3 IMPLANT
NS IRRIG 1000ML POUR BTL (IV SOLUTION) ×3 IMPLANT
PAD ARMBOARD 7.5X6 YLW CONV (MISCELLANEOUS) ×6 IMPLANT
POUCH SPECIMEN RETRIEVAL 10MM (ENDOMECHANICALS) ×3 IMPLANT
RELOAD STAPLE TA45 3.5 REG BLU (ENDOMECHANICALS) ×3 IMPLANT
SCALPEL HARMONIC ACE (MISCELLANEOUS) ×3 IMPLANT
SCISSORS LAP 5X35 DISP (ENDOMECHANICALS) ×3 IMPLANT
SET IRRIG TUBING LAPAROSCOPIC (IRRIGATION / IRRIGATOR) ×3 IMPLANT
SPECIMEN JAR SMALL (MISCELLANEOUS) ×3 IMPLANT
SUT MON AB 4-0 PC3 18 (SUTURE) ×3 IMPLANT
SUT VICRYL 0 UR6 27IN ABS (SUTURE) ×3 IMPLANT
TOWEL OR 17X24 6PK STRL BLUE (TOWEL DISPOSABLE) ×3 IMPLANT
TOWEL OR 17X26 10 PK STRL BLUE (TOWEL DISPOSABLE) ×3 IMPLANT
TRAY FOLEY CATH 16FR SILVER (SET/KITS/TRAYS/PACK) ×3 IMPLANT
TRAY LAPAROSCOPIC MC (CUSTOM PROCEDURE TRAY) ×3 IMPLANT
TROCAR XCEL BLADELESS 5X75MML (TROCAR) ×6 IMPLANT
TROCAR XCEL BLUNT TIP 100MML (ENDOMECHANICALS) ×3 IMPLANT
TUBING INSUFFLATION (TUBING) ×3 IMPLANT

## 2015-09-25 NOTE — Interval H&P Note (Signed)
History and Physical Interval Note:  09/25/2015 11:07 AM  Angel Marshall  has presented today for surgery, with the diagnosis of Appendicitis  The various methods of treatment have been discussed with the patient and family. After consideration of risks, benefits and other options for treatment, the patient has consented to  Procedure(s): APPENDECTOMY LAPAROSCOPIC (N/A) as a surgical intervention .  The patient's history has been reviewed, patient examined, no change in status, stable for surgery.  I have reviewed the patient's chart and labs.  Questions were answered to the patient's satisfaction.     Margueritte Guthridge A.

## 2015-09-25 NOTE — ED Notes (Signed)
Pt resting in bed with wife at the bedside.

## 2015-09-25 NOTE — Progress Notes (Addendum)
Pain arrived on the unit  at 150 am transported by ED nurse. Patient ambulated to bed. Vitals stable, no complaints of pain; patient oriented to unit. Patient resting in bed, wife in the room.

## 2015-09-25 NOTE — H&P (Signed)
Angel Marshall is an 35 y.o. male.   Chief Complaint: Abdominal pain HPI: Patient is a 35 year old otherwise healthy male with a less than 24-hour history of abdominal pain. Patient states that abdominal pain began generally. He states that after several hours and became localized to the right lower quadrant. Patient underwent CT scan from his PCP which revealed acute appendicitis.  Patient states he was not nauseated, no vomiting, subjective fevers.  Patient also underwent laboratory studies within the ER which an elevated WBC count.  Past Medical History:  Diagnosis Date  . GERD (gastroesophageal reflux disease)     Past Surgical History:  Procedure Laterality Date  . UMBILICAL SURGERY      Family History  Problem Relation Age of Onset  . Hyperlipidemia Mother   . Hyperlipidemia Father   . Diabetes Neg Hx   . Heart attack Neg Hx   . Hypertension Neg Hx   . Sudden death Neg Hx    Social History:  reports that he has never smoked. He does not have any smokeless tobacco history on file. He reports that he does not drink alcohol or use drugs.  Allergies: No Known Allergies   (Not in a hospital admission)  Results for orders placed or performed during the hospital encounter of 09/24/15 (from the past 48 hour(s))  CBC with Differential     Status: Abnormal   Collection Time: 09/24/15 11:38 PM  Result Value Ref Range   WBC 14.9 (H) 4.0 - 10.5 K/uL   RBC 5.03 4.22 - 5.81 MIL/uL   Hemoglobin 14.8 13.0 - 17.0 g/dL   HCT 44.9 39.0 - 52.0 %   MCV 89.3 78.0 - 100.0 fL   MCH 29.4 26.0 - 34.0 pg   MCHC 33.0 30.0 - 36.0 g/dL   RDW 13.1 11.5 - 15.5 %   Platelets 198 150 - 400 K/uL   Neutrophils Relative % 79 %   Neutro Abs 11.8 (H) 1.7 - 7.7 K/uL   Lymphocytes Relative 13 %   Lymphs Abs 1.9 0.7 - 4.0 K/uL   Monocytes Relative 7 %   Monocytes Absolute 1.1 (H) 0.1 - 1.0 K/uL   Eosinophils Relative 1 %   Eosinophils Absolute 0.1 0.0 - 0.7 K/uL   Basophils Relative 0 %   Basophils Absolute 0.0 0.0 - 0.1 K/uL  Comprehensive metabolic panel     Status: Abnormal   Collection Time: 09/24/15 11:38 PM  Result Value Ref Range   Sodium 138 135 - 145 mmol/L   Potassium 3.8 3.5 - 5.1 mmol/L   Chloride 102 101 - 111 mmol/L   CO2 28 22 - 32 mmol/L   Glucose, Bld 107 (H) 65 - 99 mg/dL   BUN 9 6 - 20 mg/dL   Creatinine, Ser 1.18 0.61 - 1.24 mg/dL   Calcium 9.7 8.9 - 10.3 mg/dL   Total Protein 7.0 6.5 - 8.1 g/dL   Albumin 4.3 3.5 - 5.0 g/dL   AST 30 15 - 41 U/L   ALT 28 17 - 63 U/L   Alkaline Phosphatase 49 38 - 126 U/L   Total Bilirubin 1.0 0.3 - 1.2 mg/dL   GFR calc non Af Amer >60 >60 mL/min   GFR calc Af Amer >60 >60 mL/min    Comment: (NOTE) The eGFR has been calculated using the CKD EPI equation. This calculation has not been validated in all clinical situations. eGFR's persistently <60 mL/min signify possible Chronic Kidney Disease.    Anion gap 8 5 -  15  Urinalysis, Routine w reflex microscopic (not at [facility])     Status: Abnormal   Collection Time: 09/24/15 11:50 PM  Result Value Ref Range   Color, Urine YELLOW YELLOW   APPearance CLEAR CLEAR   Specific Gravity, Urine 1.030 1.005 - 1.030   pH 7.0 5.0 - 8.0   Glucose, UA NEGATIVE NEGATIVE mg/dL   Hgb urine dipstick NEGATIVE NEGATIVE   Bilirubin Urine NEGATIVE NEGATIVE   Ketones, ur 40 (A) NEGATIVE mg/dL   Protein, ur NEGATIVE NEGATIVE mg/dL   Nitrite NEGATIVE NEGATIVE   Leukocytes, UA NEGATIVE NEGATIVE    Comment: MICROSCOPIC NOT DONE ON URINES WITH NEGATIVE PROTEIN, BLOOD, LEUKOCYTES, NITRITE, OR GLUCOSE <1000 mg/dL.   Ct Abdomen Pelvis W Contrast  Addendum Date: 09/24/2015   ADDENDUM REPORT: 09/24/2015 22:48 ADDENDUM: These results will be called to the ordering clinician or representative by the Radiologist Assistant, and communication documented in the PACS or zVision Dashboard. Electronically Signed   By: Ulyses Jarred M.D.   On: 09/24/2015 22:48   Result Date: 09/24/2015 CLINICAL DATA:   bloating and right lower quadrant pain EXAM: CT ABDOMEN AND PELVIS WITH CONTRAST TECHNIQUE: Multidetector CT imaging of the abdomen and pelvis was performed using the standard protocol following bolus administration of intravenous contrast. CONTRAST:  160m ISOVUE-300 IOPAMIDOL (ISOVUE-300) INJECTION 61% COMPARISON:  None. FINDINGS: Lower chest:  No pulmonary nodules or masses.  No pleural effusion. Hepatobiliary: No focal liver lesions. No biliary dilatation. The gallbladder is normal. Pancreas: Normal.  No peri-pancreatic fluid collection. Spleen: Normal Adrenals/Urinary Tract: Adrenal glands are normal. The kidneys are normal. No hydronephrosis. Stomach/Bowel: There is wall thickening of the appendix, which is mildly dilated, measuring 8 mm. There is surrounding inflammatory stranding with trace adjacent free fluid. No evidence of abscess formation or perforation. No dilated loops of bowel. Moderate stool throughout colon. Vascular/Lymphatic: The inferior vena cava, portal vein, splenic vein and superior mesenteric vein are patent. The aorta, bilateral renal arteries, celiac axis and superior mesenteric artery are patent. The visualized iliac vessels are normal. No retroperitoneal, mesenteric or pelvic adenopathy. Reproductive: Trace amount of fluid in the pelvis. Normal prostate and seminal vesicles. Other: None Musculoskeletal: No lytic or blastic osseous lesions. IMPRESSION: Acute appendicitis without evidence of abscess formation or perforation. Electronically Signed: By: KUlyses JarredM.D. On: 09/24/2015 22:04    Review of Systems  Constitutional: Positive for chills. Negative for diaphoresis, fever, malaise/fatigue and weight loss.  HENT: Negative for ear discharge, ear pain, hearing loss and tinnitus.   Eyes: Negative for blurred vision, double vision, photophobia and pain.  Respiratory: Negative for cough, hemoptysis, sputum production and shortness of breath.   Cardiovascular: Negative for chest  pain, palpitations, orthopnea and claudication.  Gastrointestinal: Positive for abdominal pain. Negative for blood in stool, constipation, diarrhea, heartburn, melena, nausea and vomiting.  Genitourinary: Negative for dysuria, flank pain and urgency.  Musculoskeletal: Negative for joint pain, myalgias and neck pain.  Skin: Negative for itching and rash.  Neurological: Negative for dizziness, tingling, tremors, sensory change, speech change, weakness and headaches.    Blood pressure 113/70, pulse 92, temperature 98.7 F (37.1 C), temperature source Oral, resp. rate 24, height _0  (1.803 m), weight 90.7 kg (200 lb), SpO2 97 %. Physical Exam  Constitutional: He is oriented to person, place, and time. He appears well-developed and well-nourished. No distress.  HENT:  Head: Normocephalic and atraumatic.  Right Ear: External ear normal.  Left Ear: External ear normal.  Eyes: Conjunctivae and EOM are  normal. Pupils are equal, round, and reactive to light.  Neck: Normal range of motion. Neck supple. No JVD present. No tracheal deviation present. No thyromegaly present.  Cardiovascular: Normal rate, regular rhythm, normal heart sounds and intact distal pulses.  Exam reveals no gallop and no friction rub.   No murmur heard. Respiratory: Effort normal and breath sounds normal. No stridor. No respiratory distress. He has no wheezes. He has no rales. He exhibits no tenderness.  GI: Soft. Bowel sounds are normal. He exhibits no distension and no mass. There is tenderness. There is tenderness at McBurney's point. There is no rebound and no guarding.  Musculoskeletal: Normal range of motion.  Neurological: He is oriented to person, place, and time.  Skin: Skin is warm and dry. He is not diaphoretic.  Psychiatric: He has a normal mood and affect. His behavior is normal.     Assessment/Plan 35 year old male with acute appendicitis, nonruptured  1. Admit to the hospital, nothing by mouth, IV fluids,  abx 2. Consent for lap appendectomy by Dr. Brantley Stage 3. I discussed with the patient the risks benefits of the procedure to include but not limited to: Infection, bleeding, damage to surrounding structures, possible ileus, possible postoperative infection. Patient voiced understanding and wishes to proceed.   Reyes Ivan, MD 09/25/2015, 1:19 AM

## 2015-09-25 NOTE — Anesthesia Postprocedure Evaluation (Signed)
Anesthesia Post Note  Patient: Angel Marshall  Procedure(s) Performed: Procedure(s) (LRB): APPENDECTOMY LAPAROSCOPIC (N/A)  Patient location during evaluation: PACU Anesthesia Type: General Level of consciousness: awake and alert Pain management: pain level controlled Vital Signs Assessment: post-procedure vital signs reviewed and stable Respiratory status: spontaneous breathing, nonlabored ventilation, respiratory function stable and patient connected to nasal cannula oxygen Cardiovascular status: blood pressure returned to baseline and stable Postop Assessment: no signs of nausea or vomiting Anesthetic complications: no    Last Vitals:  Vitals:   09/25/15 1330 09/25/15 1336  BP: 106/68   Pulse: 72 72  Resp: 19 (!) 21  Temp:  36.9 C    Last Pain:  Vitals:   09/25/15 1330  TempSrc:   PainSc: 3                  Chukwudi Ewen Minda Meo

## 2015-09-25 NOTE — Progress Notes (Signed)
Patient arrived back to unit from surgery. Surgical site clean, dry, and intact. Patient alert and oriented.

## 2015-09-25 NOTE — ED Provider Notes (Signed)
[place] [place] DEPT Provider Note   CSN: Harper:[id number] Arrival date & time: 09/24/15  2317     History   Chief Complaint Chief Complaint  Patient presents with  . Other    APPENDICITIS    HPI Angel Marshall is a 35 y.o. male.  Patient's presents with CT scan positive for appendicitis. This was ordered by his care doctor today. Patient developed a bloating sensation early this morning. As they progressed he developed right lower quadrant pain prompting evaluation. No associated fevers, nausea, vomiting. Patient has had decreased appetite. No diarrhea or constipation. Patient last ate 2 hours ago. No treatments prior to arrival. 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 The onset of this condition was acute. The course is constant. Aggravating factors: none. Alleviating factors: none.        Past Medical History:  Diagnosis Date  . GERD (gastroesophageal reflux disease)     Patient Active Problem List   Diagnosis Date Noted  . Left knee pain 08/08/2015  . Right shoulder pain 04/10/2011    Past Surgical History:  Procedure Laterality Date  . UMBILICAL SURGERY         Home Medications    Prior to Admission medications   Medication Sig Start Date End Date Taking? Authorizing Provider  amoxicillin-clavulanate (AUGMENTIN) 875-125 MG tablet Take 1 tablet by mouth 2 (two) times daily. 07/18/15   Darreld Mclean, MD  Cholecalciferol (VITAMIN D3) 5000 units CAPS Take 1 capsule by mouth daily.    Historical Provider, MD  meloxicam (MOBIC) 15 MG tablet Take 1 tablet by mouth daily as needed. 05/15/15   Historical Provider, MD  Multiple Vitamin (MULTIVITAMIN) tablet Take 1 tablet by mouth daily.    Historical Provider, MD  naproxen (NAPROSYN) 500 MG tablet Take 1 tablet by mouth 2 (two) times daily. 05/07/15   Historical Provider, MD  ranitidine (ZANTAC) 150 MG capsule Take 150 mg by mouth 2 (two) times daily.    Historical Provider, MD  SUDOGEST 30 MG tablet Take  1-2 tablets every 4-6 hours as needed 05/15/15   Historical Provider, MD    Family History Family History  Problem Relation Age of Onset  . Hyperlipidemia Mother   . Hyperlipidemia Father   . Diabetes Neg Hx   . Heart attack Neg Hx   . Hypertension Neg Hx   . Sudden death Neg Hx     Social History Social History  Substance Use Topics  . Smoking status: Never Smoker  . Smokeless tobacco: Not on file  . Alcohol use No     Allergies   Review of patient's allergies indicates no known allergies.   Review of Systems Review of Systems  Constitutional: Positive for appetite change. Negative for fever.  HENT: Negative for rhinorrhea and sore throat.   Eyes: Negative for redness.  Respiratory: Negative for cough.   Cardiovascular: Negative for chest pain.  Gastrointestinal: Positive for abdominal pain. Negative for diarrhea, nausea and vomiting.  Genitourinary: Negative for dysuria.  Musculoskeletal: Negative for myalgias.  Skin: Negative for rash.  Neurological: Negative for headaches.     Physical Exam Updated Vital Signs BP 112/70 (BP Location: Left Arm)   Pulse 99   Temp 98.7 F (37.1 C) (Oral)   Resp 18   Ht 5\' 11"  (1.803 m)   Wt 90.7 kg   SpO2 98%   BMI 27.89 kg/m   Physical Exam  Constitutional: He appears well-developed and well-nourished.  HENT:  Head: Normocephalic and atraumatic.  Eyes: Conjunctivae are normal. Right eye exhibits no discharge. Left eye exhibits no discharge.  Neck: Normal range of motion. Neck supple.  Cardiovascular: Normal rate, regular rhythm and normal heart sounds.   Pulmonary/Chest: Effort normal and breath sounds normal.  Abdominal: Soft. He exhibits no distension. There is tenderness. There is no rebound and no guarding.  Positive Rovsing sign. Negative psoas sign and obturator internus sign.  Neurological: He is alert.  Skin: Skin is warm and dry.  Psychiatric: He has a normal mood and affect.  Nursing note and vitals  reviewed.    ED Treatments / Results  Labs (all labs ordered are listed, but only abnormal results are displayed) Labs Reviewed  CBC WITH DIFFERENTIAL/PLATELET - Abnormal; Notable for the following:       Result Value   WBC 14.9 (*)    Neutro Abs 11.8 (*)    Monocytes Absolute 1.1 (*)    All other components within normal limits  COMPREHENSIVE METABOLIC PANEL - Abnormal; Notable for the following:    Glucose, Bld 107 (*)    All other components within normal limits  URINALYSIS, ROUTINE W REFLEX MICROSCOPIC (NOT AT [facility]) - Abnormal; Notable for the following:    Ketones, ur 40 (*)    All other components within normal limits    Radiology Ct Abdomen Pelvis W Contrast  Addendum Date: 09/24/2015   ADDENDUM REPORT: 09/24/2015 22:48 ADDENDUM: These results will be called to the ordering clinician or representative by the Radiologist Assistant, and communication documented in the PACS or zVision Dashboard. Electronically Signed   By: Ulyses Jarred M.D.   On: 09/24/2015 22:48   Result Date: 09/24/2015 CLINICAL DATA:  bloating and right lower quadrant pain EXAM: CT ABDOMEN AND PELVIS WITH CONTRAST TECHNIQUE: Multidetector CT imaging of the abdomen and pelvis was performed using the standard protocol following bolus administration of intravenous contrast. CONTRAST:  174mL ISOVUE-300 IOPAMIDOL (ISOVUE-300) INJECTION 61% COMPARISON:  None. FINDINGS: Lower chest:  No pulmonary nodules or masses.  No pleural effusion. Hepatobiliary: No focal liver lesions. No biliary dilatation. The gallbladder is normal. Pancreas: Normal.  No peri-pancreatic fluid collection. Spleen: Normal Adrenals/Urinary Tract: Adrenal glands are normal. The kidneys are normal. No hydronephrosis. Stomach/Bowel: There is wall thickening of the appendix, which is mildly dilated, measuring 8 mm. There is surrounding inflammatory stranding with trace adjacent free fluid. No evidence of abscess formation or perforation. No dilated loops  of bowel. Moderate stool throughout colon. Vascular/Lymphatic: The inferior vena cava, portal vein, splenic vein and superior mesenteric vein are patent. The aorta, bilateral renal arteries, celiac axis and superior mesenteric artery are patent. The visualized iliac vessels are normal. No retroperitoneal, mesenteric or pelvic adenopathy. Reproductive: Trace amount of fluid in the pelvis. Normal prostate and seminal vesicles. Other: None Musculoskeletal: No lytic or blastic osseous lesions. IMPRESSION: Acute appendicitis without evidence of abscess formation or perforation. Electronically Signed: By: Ulyses Jarred M.D. On: 09/24/2015 22:04    Procedures Procedures (including critical care time)  Medications Ordered in ED Medications - No data to display   Initial Impression / Assessment and Plan / ED Course  I have reviewed the triage vital signs and the nursing notes.  Pertinent labs & imaging results that were available during my care of the patient were reviewed by me and considered in my medical decision making (see chart for details).  Clinical Course  Comment By Time  Patient seen and examined. Pain is controlled. Will call surgery for admission. Last oral intake 2  hours ago. Does not want anything for pain at this time. Carlisle Cater, PA-C 08/15 0020  Spoke with Dr. Rosendo Gros who will see.  Carlisle Cater, PA-C 08/15 0024     Final Clinical Impressions(s) / ED Diagnoses   Final diagnoses:  Acute appendicitis with localized peritonitis   Admit to surgery.   New Prescriptions New Prescriptions   No medications on file     Carlisle Cater, PA-C 09/25/15 0025    Veryl Speak, MD 09/25/15 [phone]

## 2015-09-25 NOTE — H&P (View-Only) (Signed)
Subjective: Pt feels pain RLQ abdomen   Objective: Vital signs in last 24 hours: Temp:  [98.4 F (36.9 C)-98.7 F (37.1 C)] 98.4 F (36.9 C) (08/15 0532) Pulse Rate:  [81-99] 81 (08/15 0532) Resp:  [10-25] 19 (08/15 0532) BP: (104-124)/(55-78) 107/57 (08/15 0532) SpO2:  [96 %-98 %] 96 % (08/15 0532) Weight:  [90.7 kg (200 lb)] 90.7 kg (200 lb) (08/14 2326)    Intake/Output from previous day: No intake/output data recorded. Intake/Output this shift: No intake/output data recorded.  GI: tender RLQ abdomen  Lab Results:   Recent Labs  09/24/15 2338  WBC 14.9*  HGB 14.8  HCT 44.9  PLT 198   BMET  Recent Labs  09/24/15 2338  NA 138  K 3.8  CL 102  CO2 28  GLUCOSE 107*  BUN 9  CREATININE 1.18  CALCIUM 9.7   PT/INR No results for input(s): LABPROT, INR in the last 72 hours. ABG No results for input(s): PHART, HCO3 in the last 72 hours.  Invalid input(s): PCO2, PO2  Studies/Results: Ct Abdomen Pelvis W Contrast  Addendum Date: 09/24/2015   ADDENDUM REPORT: 09/24/2015 22:48 ADDENDUM: These results will be called to the ordering clinician or representative by the Radiologist Assistant, and communication documented in the PACS or zVision Dashboard. Electronically Signed   By: Ulyses Jarred M.D.   On: 09/24/2015 22:48   Result Date: 09/24/2015 CLINICAL DATA:  bloating and right lower quadrant pain EXAM: CT ABDOMEN AND PELVIS WITH CONTRAST TECHNIQUE: Multidetector CT imaging of the abdomen and pelvis was performed using the standard protocol following bolus administration of intravenous contrast. CONTRAST:  132mL ISOVUE-300 IOPAMIDOL (ISOVUE-300) INJECTION 61% COMPARISON:  None. FINDINGS: Lower chest:  No pulmonary nodules or masses.  No pleural effusion. Hepatobiliary: No focal liver lesions. No biliary dilatation. The gallbladder is normal. Pancreas: Normal.  No peri-pancreatic fluid collection. Spleen: Normal Adrenals/Urinary Tract: Adrenal glands are normal. The  kidneys are normal. No hydronephrosis. Stomach/Bowel: There is wall thickening of the appendix, which is mildly dilated, measuring 8 mm. There is surrounding inflammatory stranding with trace adjacent free fluid. No evidence of abscess formation or perforation. No dilated loops of bowel. Moderate stool throughout colon. Vascular/Lymphatic: The inferior vena cava, portal vein, splenic vein and superior mesenteric vein are patent. The aorta, bilateral renal arteries, celiac axis and superior mesenteric artery are patent. The visualized iliac vessels are normal. No retroperitoneal, mesenteric or pelvic adenopathy. Reproductive: Trace amount of fluid in the pelvis. Normal prostate and seminal vesicles. Other: None Musculoskeletal: No lytic or blastic osseous lesions. IMPRESSION: Acute appendicitis without evidence of abscess formation or perforation. Electronically Signed: By: Ulyses Jarred M.D. On: 09/24/2015 22:04    Anti-infectives: Anti-infectives    Start     Dose/Rate Route Frequency Ordered Stop   09/25/15 0200  metroNIDAZOLE (FLAGYL) IVPB 500 mg     500 mg 100 mL/hr over 60 Minutes Intravenous Every 8 hours 09/25/15 0119     09/25/15 0145  cefTRIAXone (ROCEPHIN) 2 g in dextrose 5 % 50 mL IVPB     2 g 100 mL/hr over 30 Minutes Intravenous Daily 09/25/15 0119        Assessment/Plan: Acute appendicitis Discussed medical and surgical management of appendicitis and the pro and cons of each Pt has elected to undergo laparoscopic appendectomy The procedure has been discussed with the patient.  Alternative therapies have been discussed with the patient.  Operative risks include bleeding,  Infection,  Organ injury,  Nerve injury,  Blood vessel injury,  open surgery bowel injury  Abscess  DVT,  Pulmonary embolism,  Death,  And possible reoperation.  Medical management risks include worsening of present situation.  The success of the procedure is 50 -99 % at treating patients symptoms.  The patient  understands and agrees to proceed.   LOS: 0 days    Angel Marshall A. 09/25/2015

## 2015-09-25 NOTE — Transfer of Care (Signed)
Immediate Anesthesia Transfer of Care Note  Patient: Angel Marshall  Procedure(s) Performed: Procedure(s): APPENDECTOMY LAPAROSCOPIC (N/A)  Patient Location: PACU  Anesthesia Type:General  Level of Consciousness: awake, alert , oriented and patient cooperative  Airway & Oxygen Therapy: Patient Spontanous Breathing  Post-op Assessment: Report given to RN, Post -op Vital signs reviewed and stable and Patient moving all extremities  Post vital signs: Reviewed and stable  Last Vitals:  Vitals:   09/25/15 0532 09/25/15 1100  BP: (!) 107/57 (!) 101/56  Pulse: 81 78  Resp: 19 18  Temp: 36.9 C 37.3 C    Last Pain:  Vitals:   09/25/15 1100  TempSrc: Oral  PainSc:          Complications: No apparent anesthesia complications

## 2015-09-25 NOTE — Op Note (Signed)
Appendectomy, Lap, Procedure Note  Indications: The patient presented with a history of right-sided abdominal pain. A CT  revealed findings consistent with acute appendicitis.  The procedure was discussed with the patient.  Laparoscopicappendectomy discussed with the patient as well as non operative treatments. The risks of operative management include bleeding,  Infection,  Leak of anastamosis,  Ostomy formation, open procedure,  Sepsis,  Abcess,  Hernia,  DVT,  Pulmonary complications,  Cardiovascular  complications,  Injury to ureter,  Bladder,kidney,and anesthesia risks,  And death. The patient understands.  Questions answered.   The success of the procedure is 50-95 % for treating the patients symptoms. They agree to proceed.  Pre-operative Diagnosis: Acute appendicitis without mention of peritonitis  Post-operative Diagnosis: Same  Surgeon: Ann Groeneveld A.   Assistants: none   Anesthesia: General endotracheal anesthesia and Local anesthesia 0.25.% bupivacaine, with epinephrine  ASA Class: 1  Procedure Details  The patient was seen again in the Holding Room. The risks, benefits, complications, treatment options, and expected outcomes were discussed with the patient and/or family. The possibilities of reaction to medication, pulmonary aspiration, perforation of viscus, bleeding, recurrent infection, finding a normal appendix, the need for additional procedures, failure to diagnose a condition, and creating a complication requiring transfusion or operation were discussed. There was concurrence with the proposed plan and informed consent was obtained. The site of surgery was properly noted/marked. The patient was taken to Operating Room, identified as ALEXANDAR STOWELL and the procedure verified as Appendectomy. A Time Out was held and the above information confirmed.  The patient was placed in the supine position and general anesthesia was induced, along with placement of orogastric tube,  Venodyne boots, and a Foley catheter. The abdomen was prepped and draped in a sterile fashion. A one centimeter infraumbilical incision was made and the peritoneal cavity was accessed using the OPEN  technique. The pneumoperitoneum was then established to steady pressure of 12 mmHg. A 12 mm port was placed through the umbilical incision. Additional 5 mm cannulas then placed in the left lower quadrant of the abdomen and half way between the umbilicus and xiphoid  under direct vision. A careful evaluation of the entire abdomen was carried out. The patient was placed in Trendelenburg and left lateral decubitus position. The small intestines were retracted in the cephalad and left lateral direction away from the pelvis and right lower quadrant. The patient was found to have an enlarged and inflamed appendix that was extending into the pelvis. There was no evidence of perforation.  The appendix was carefully dissected. A window was made in the mesoappendix at the base of the appendix. A harmonic scalpel was used across the mesoappendix. The appendix was divided at its base using an endo-GIA stapler. Minimal appendiceal stump was left in place. There was no evidence of bleeding, leakage, or complication after division of the appendix. Irrigation was also performed and irrigate suctioned from the abdomen as well.  The umbilical port site was closed using 0 vicryl pursestring sutures fashion at the level of the fascia. The trocar site skin wounds were closed using 4 0 monocryl and adhesive .  Instrument, sponge, and needle counts were correct at the conclusion of the case.   Findings: The appendix was found to be inflamed. There were not signs of necrosis.  There was not perforation. There was not abscess formation.  Estimated Blood Loss:  Minimal         Drains: none  Total IV Fluids: 800 mL         Specimens: appendix          Complications:  None; patient tolerated the procedure well.          Disposition: PACU - hemodynamically stable.         Condition: stable

## 2015-09-25 NOTE — Progress Notes (Signed)
Subjective: Pt feels pain RLQ abdomen   Objective: Vital signs in last 24 hours: Temp:  [98.4 F (36.9 C)-98.7 F (37.1 C)] 98.4 F (36.9 C) (08/15 0532) Pulse Rate:  [81-99] 81 (08/15 0532) Resp:  [10-25] 19 (08/15 0532) BP: (104-124)/(55-78) 107/57 (08/15 0532) SpO2:  [96 %-98 %] 96 % (08/15 0532) Weight:  [90.7 kg (200 lb)] 90.7 kg (200 lb) (08/14 2326)    Intake/Output from previous day: No intake/output data recorded. Intake/Output this shift: No intake/output data recorded.  GI: tender RLQ abdomen  Lab Results:   Recent Labs  09/24/15 2338  WBC 14.9*  HGB 14.8  HCT 44.9  PLT 198   BMET  Recent Labs  09/24/15 2338  NA 138  K 3.8  CL 102  CO2 28  GLUCOSE 107*  BUN 9  CREATININE 1.18  CALCIUM 9.7   PT/INR No results for input(s): LABPROT, INR in the last 72 hours. ABG No results for input(s): PHART, HCO3 in the last 72 hours.  Invalid input(s): PCO2, PO2  Studies/Results: Ct Abdomen Pelvis W Contrast  Addendum Date: 09/24/2015   ADDENDUM REPORT: 09/24/2015 22:48 ADDENDUM: These results will be called to the ordering clinician or representative by the Radiologist Assistant, and communication documented in the PACS or zVision Dashboard. Electronically Signed   By: Ulyses Jarred M.D.   On: 09/24/2015 22:48   Result Date: 09/24/2015 CLINICAL DATA:  bloating and right lower quadrant pain EXAM: CT ABDOMEN AND PELVIS WITH CONTRAST TECHNIQUE: Multidetector CT imaging of the abdomen and pelvis was performed using the standard protocol following bolus administration of intravenous contrast. CONTRAST:  151mL ISOVUE-300 IOPAMIDOL (ISOVUE-300) INJECTION 61% COMPARISON:  None. FINDINGS: Lower chest:  No pulmonary nodules or masses.  No pleural effusion. Hepatobiliary: No focal liver lesions. No biliary dilatation. The gallbladder is normal. Pancreas: Normal.  No peri-pancreatic fluid collection. Spleen: Normal Adrenals/Urinary Tract: Adrenal glands are normal. The  kidneys are normal. No hydronephrosis. Stomach/Bowel: There is wall thickening of the appendix, which is mildly dilated, measuring 8 mm. There is surrounding inflammatory stranding with trace adjacent free fluid. No evidence of abscess formation or perforation. No dilated loops of bowel. Moderate stool throughout colon. Vascular/Lymphatic: The inferior vena cava, portal vein, splenic vein and superior mesenteric vein are patent. The aorta, bilateral renal arteries, celiac axis and superior mesenteric artery are patent. The visualized iliac vessels are normal. No retroperitoneal, mesenteric or pelvic adenopathy. Reproductive: Trace amount of fluid in the pelvis. Normal prostate and seminal vesicles. Other: None Musculoskeletal: No lytic or blastic osseous lesions. IMPRESSION: Acute appendicitis without evidence of abscess formation or perforation. Electronically Signed: By: Ulyses Jarred M.D. On: 09/24/2015 22:04    Anti-infectives: Anti-infectives    Start     Dose/Rate Route Frequency Ordered Stop   09/25/15 0200  metroNIDAZOLE (FLAGYL) IVPB 500 mg     500 mg 100 mL/hr over 60 Minutes Intravenous Every 8 hours 09/25/15 0119     09/25/15 0145  cefTRIAXone (ROCEPHIN) 2 g in dextrose 5 % 50 mL IVPB     2 g 100 mL/hr over 30 Minutes Intravenous Daily 09/25/15 0119        Assessment/Plan: Acute appendicitis Discussed medical and surgical management of appendicitis and the pro and cons of each Pt has elected to undergo laparoscopic appendectomy The procedure has been discussed with the patient.  Alternative therapies have been discussed with the patient.  Operative risks include bleeding,  Infection,  Organ injury,  Nerve injury,  Blood vessel injury,  open surgery bowel injury  Abscess  DVT,  Pulmonary embolism,  Death,  And possible reoperation.  Medical management risks include worsening of present situation.  The success of the procedure is 50 -99 % at treating patients symptoms.  The patient  understands and agrees to proceed.   LOS: 0 days    Audree Schrecengost A. 09/25/2015

## 2015-09-25 NOTE — Anesthesia Procedure Notes (Signed)
Procedure Name: Intubation Date/Time: 09/25/2015 11:44 AM Performed by: Rebekah Chesterfield L Pre-anesthesia Checklist: Patient identified, Emergency Drugs available, Suction available and Patient being monitored Patient Re-evaluated:Patient Re-evaluated prior to inductionOxygen Delivery Method: Circle System Utilized Preoxygenation: Pre-oxygenation with 100% oxygen Intubation Type: IV induction, Rapid sequence and Cricoid Pressure applied Laryngoscope Size: Mac and 4 Grade View: Grade I Tube type: Oral Tube size: 7.5 mm Number of attempts: 1 Airway Equipment and Method: Stylet Placement Confirmation: ETT inserted through vocal cords under direct vision,  positive ETCO2 and breath sounds checked- equal and bilateral Secured at: 23 cm Tube secured with: Tape Dental Injury: Teeth and Oropharynx as per pre-operative assessment

## 2015-09-25 NOTE — Anesthesia Preprocedure Evaluation (Signed)
Anesthesia Evaluation  Patient identified by MRN, date of birth, ID band Patient awake    Reviewed: Allergy & Precautions, NPO status , Patient's Chart, lab work & pertinent test results  Airway Mallampati: I  TM Distance: >3 FB Neck ROM: Full    Dental no notable dental hx.    Pulmonary neg pulmonary ROS,    Pulmonary exam normal        Cardiovascular negative cardio ROS Normal cardiovascular exam     Neuro/Psych negative neurological ROS     GI/Hepatic Neg liver ROS, GERD  Medicated,  Endo/Other  negative endocrine ROS  Renal/GU negative Renal ROS     Musculoskeletal negative musculoskeletal ROS (+)   Abdominal   Peds  Hematology negative hematology ROS (+)   Anesthesia Other Findings   Reproductive/Obstetrics                             Anesthesia Physical Anesthesia Plan  ASA: I  Anesthesia Plan: General   Post-op Pain Management:    Induction: Intravenous  Airway Management Planned: Oral ETT  Additional Equipment: None  Intra-op Plan:   Post-operative Plan: Extubation in OR  Informed Consent: I have reviewed the patients History and Physical, chart, labs and discussed the procedure including the risks, benefits and alternatives for the proposed anesthesia with the patient or authorized representative who has indicated his/her understanding and acceptance.   Dental advisory given  Plan Discussed with:   Anesthesia Plan Comments:         Anesthesia Quick Evaluation

## 2015-09-26 ENCOUNTER — Encounter (HOSPITAL_COMMUNITY): Payer: Self-pay | Admitting: Surgery

## 2015-09-26 DIAGNOSIS — R100 Acute abdomen: Secondary | ICD-10-CM | POA: Diagnosis not present

## 2015-09-26 DIAGNOSIS — K3580 Unspecified acute appendicitis: Secondary | ICD-10-CM | POA: Diagnosis not present

## 2015-09-26 MED ORDER — OXYCODONE-ACETAMINOPHEN 5-325 MG PO TABS: 1.0000 | ORAL_TABLET | ORAL | 0 refills | Status: DC | PRN

## 2015-09-26 NOTE — Progress Notes (Signed)
Tolerated regular diet. Discharged home accompanied by wife.

## 2015-09-26 NOTE — Discharge Summary (Signed)
[facility] Discharge Summary   Patient ID: Angel Marshall MRN: [id number] DOB/AGE: [birth year] 35 y.o.  Admit date: 09/24/2015 Discharge date: 09/26/2015  Admitting Diagnosis: Acute appendicitis  Discharge Diagnosis Patient Active Problem List   Diagnosis Date Noted  . Acute appendicitis 09/25/2015  . Left knee pain 08/08/2015  . Right shoulder pain 04/10/2011    Consultants None   Imaging: Ct Abdomen Pelvis W Contrast  Addendum Date: 09/24/2015   ADDENDUM REPORT: 09/24/2015 22:48 ADDENDUM: These results will be called to the ordering clinician or representative by the Radiologist Assistant, and communication documented in the PACS or zVision Dashboard. Electronically Signed   By: Ulyses Jarred M.D.   On: 09/24/2015 22:48   Result Date: 09/24/2015 CLINICAL DATA:  bloating and right lower quadrant pain EXAM: CT ABDOMEN AND PELVIS WITH CONTRAST TECHNIQUE: Multidetector CT imaging of the abdomen and pelvis was performed using the standard protocol following bolus administration of intravenous contrast. CONTRAST:  169mL ISOVUE-300 IOPAMIDOL (ISOVUE-300) INJECTION 61% COMPARISON:  None. FINDINGS: Lower chest:  No pulmonary nodules or masses.  No pleural effusion. Hepatobiliary: No focal liver lesions. No biliary dilatation. The gallbladder is normal. Pancreas: Normal.  No peri-pancreatic fluid collection. Spleen: Normal Adrenals/Urinary Tract: Adrenal glands are normal. The kidneys are normal. No hydronephrosis. Stomach/Bowel: There is wall thickening of the appendix, which is mildly dilated, measuring 8 mm. There is surrounding inflammatory stranding with trace adjacent free fluid. No evidence of abscess formation or perforation. No dilated loops of bowel. Moderate stool throughout colon. Vascular/Lymphatic: The inferior vena cava, portal vein, splenic vein and superior mesenteric vein are patent. The aorta, bilateral renal arteries, celiac axis and superior mesenteric artery  are patent. The visualized iliac vessels are normal. No retroperitoneal, mesenteric or pelvic adenopathy. Reproductive: Trace amount of fluid in the pelvis. Normal prostate and seminal vesicles. Other: None Musculoskeletal: No lytic or blastic osseous lesions. IMPRESSION: Acute appendicitis without evidence of abscess formation or perforation. Electronically Signed: By: Ulyses Jarred M.D. On: 09/24/2015 22:04    Procedures Dr. Brantley Stage (09/25/15) - Laparoscopic Appendectomy  Hospital Course:  Angel Marshall is a 35yo male who presented to [facility] as a transfer from [facility] with RLQ pain.  He had a CT scan positive for acute appendicitis, as well as an elevated white count. Patient was admitted and underwent procedure listed above.  Tolerated procedure well and was transferred to the floor.  On POD1, the patient was felt stable for discharge home.  Patient will follow up in our office in 2 weeks and knows to call with questions or concerns.  He will call to confirm appointment date/time.        Medication List    STOP taking these medications   amoxicillin-clavulanate 875-125 MG tablet Commonly known as:  AUGMENTIN     TAKE these medications   Fish Oil 1000 MG Caps Take 1,000 mg by mouth daily.   oxyCODONE-acetaminophen 5-325 MG tablet Commonly known as:  PERCOCET/ROXICET Take 1-2 tablets by mouth every 4 (four) hours as needed for moderate pain.   ranitidine 150 MG capsule Commonly known as:  ZANTAC Take 150 mg by mouth 2 (two) times daily.        Follow-up [facility], [place]. Schedule an appointment as soon as possible for a visit in 2 week(s).   Specialty:  General Surgery Why:  for post operative follow up from your laparoscopic appendectomy.  Contact information: [address] [place]  [place] [phone]           Signed: Jerrye Beavers, [facility] 09/26/2015, 2:31  PM Pager: [phone] Consults: [phone] Mon-Fri 7:00 am-4:30 pm Sat-Sun 7:00 am-11:30 am

## 2015-09-26 NOTE — Discharge Instructions (Signed)
Please schedule an appointment for follow up in 2 weeks. Please arrive at least 30 min before your appointment to complete your check in paperwork.  If you are unable to arrive 30 min prior to your appointment time we may have to cancel or reschedule you.  LAPAROSCOPIC SURGERY: POST OP INSTRUCTIONS  1. DIET: Follow a light bland diet the first 24 hours after arrival home, such as soup, liquids, crackers, etc. Be sure to include lots of fluids daily. Avoid fast food or heavy meals as your are more likely to get nauseated. Eat a low fat the next few days after surgery.  2. Take your usually prescribed home medications unless otherwise directed. 3. PAIN CONTROL:  1. Pain is best controlled by a usual combination of three different methods TOGETHER:  1. Ice/Heat 2. Over the counter pain medication 3. Prescription pain medication 2. Most patients will experience some swelling and bruising around the incisions. Ice packs or heating pads (30-60 minutes up to 6 times a day) will help. Use ice for the first few days to help decrease swelling and bruising, then switch to heat to help relax tight/sore spots and speed recovery. Some people prefer to use ice alone, heat alone, alternating between ice & heat. Experiment to what works for you. Swelling and bruising can take several weeks to resolve.  3. It is helpful to take an over-the-counter pain medication regularly for the first few weeks. Choose one of the following that works best for you:  1. Naproxen (Aleve, etc) Two 220mg  tabs twice a day 2. Ibuprofen (Advil, etc) Three 200mg  tabs four times a day (every meal & bedtime) 3. Acetaminophen (Tylenol, etc) 500-650mg  four times a day (every meal & bedtime) 4. A prescription for pain medication (such as oxycodone, hydrocodone, etc) should be given to you upon discharge. Take your pain medication as prescribed.  1. If you are having problems/concerns with the prescription medicine (does not control pain, nausea,  vomiting, rash, itching, etc), please call us [phone] to see if we need to switch you to a different pain medicine that will work better for you and/or control your side effect better. 2. If you need a refill on your pain medication, please contact your pharmacy. They will contact our office to request authorization. Prescriptions will not be filled after 5 pm or on week-ends. 4. Avoid getting constipated. Between the surgery and the pain medications, it is common to experience some constipation. Increasing fluid intake and taking a fiber supplement (such as Metamucil, Citrucel, FiberCon, MiraLax, etc) 1-2 times a day regularly will usually help prevent this problem from occurring. A mild laxative (prune juice, Milk of Magnesia, MiraLax, etc) should be taken according to package directions if there are no bowel movements after 48 hours.  5. Watch out for diarrhea. If you have many loose bowel movements, simplify your diet to bland foods & liquids for a few days. Stop any stool softeners and decrease your fiber supplement. Switching to mild anti-diarrheal medications (Kayopectate, Pepto Bismol) can help. If this worsens or does not improve, please call us. 6. Wash / shower every day. You may shower over the dressings as they are waterproof. Continue to shower over incision(s) after the dressing is off. 7. Remove your waterproof bandages 5 days after surgery. You may leave the incision open to air. You may replace a dressing/Band-Aid to cover the incision for comfort if you wish.  8. ACTIVITIES as tolerated:  1. You may resume regular (light) daily activities  beginning the next day--such as daily self-care, walking, climbing stairs--gradually increasing activities as tolerated. If you can walk 30 minutes without difficulty, it is safe to try more intense activity such as jogging, treadmill, bicycling, low-impact aerobics, swimming, etc. 2. Save the most intensive and strenuous activity for last such as  sit-ups, heavy lifting, contact sports, etc Refrain from any heavy lifting or straining until you are off narcotics for pain control.  3. DO NOT PUSH THROUGH PAIN. Let pain be your guide: If it hurts to do something, don't do it. Pain is your body warning you to avoid that activity for another week until the pain goes down. 4. You may drive when you are no longer taking prescription pain medication, you can comfortably wear a seatbelt, and you can safely maneuver your car and apply brakes. 5. You may have sexual intercourse when it is comfortable.  9. FOLLOW UP in our office  1. Please call [facility] at [phone] to set up an appointment to see your surgeon in the office for a follow-up appointment approximately 2-3 weeks after your surgery. 2. Make sure that you call for this appointment the day you arrive home to insure a convenient appointment time.      10. IF YOU HAVE DISABILITY OR FAMILY LEAVE FORMS, BRING THEM TO THE               OFFICE FOR PROCESSING.   WHEN TO CALL us [phone]:  1. Poor pain control 2. Reactions / problems with new medications (rash/itching, nausea, etc)  3. Fever over 101.5 F (38.5 C) 4. Inability to urinate 5. Nausea and/or vomiting 6. Worsening swelling or bruising 7. Continued bleeding from incision. 8. Increased pain, redness, or drainage from the incision  The clinic staff is available to answer your questions during regular business hours (8:30am-5pm). Please dont hesitate to call and ask to speak to one of our nurses for clinical concerns.  If you have a medical emergency, go to the nearest emergency room or call 911.  A surgeon from [facility] is always on call at the [facility], [address], [place], [place], [place] [address] ?  MAIN: [phone] ? TOLL FREE: [phone] ?  FAX [phone]  [email or web address]

## 2015-10-16 ENCOUNTER — Encounter: Payer: Self-pay | Admitting: Podiatry

## 2015-10-16 ENCOUNTER — Ambulatory Visit (INDEPENDENT_AMBULATORY_CARE_PROVIDER_SITE_OTHER): Payer: 59 | Admitting: Podiatry

## 2015-10-16 DIAGNOSIS — M79673 Pain in unspecified foot: Secondary | ICD-10-CM | POA: Diagnosis not present

## 2015-10-16 NOTE — Progress Notes (Signed)
   Subjective:    Patient ID: Angel Marshall, male    DOB: 12/14/1980, 35 y.o.   MRN: [id number]  HPI  35 year old male presents the office today as he discuss overall foot health and helps to prevent any problems. He states after standing 10-12 hours a day he gets some pain to the back of his heel however this is not consistent. The shoes that he is wearing are about 36 years old before pursuing new ones he would like to discuss what to look for. Denies any pain today. Denies any surrounding redness or swelling. No numbness or tingling. The pain does not wake him at night.  Review of Systems  All other systems reviewed and are negative.      Objective:   Physical Exam General: AAO x3, NAD  Dermatological: Skin is warm, dry and supple bilateral. Nails x 10 are well manicured; remaining integument appears unremarkable at this time. There are no open sores, no preulcerative lesions, no rash or signs of infection present.  Vascular: Dorsalis Pedis artery and Posterior Tibial artery pedal pulses are 2/4 bilateral with immedate capillary fill time. Pedal hair growth present. There is no pain with calf compression, swelling, warmth, erythema.   Neruologic: Grossly intact via light touch bilateral. Vibratory intact via tuning fork bilateral. Protective threshold with Semmes Wienstein monofilament intact to all pedal sites bilateral.   Musculoskeletal: At this time there is no area tenderness bilaterally. There is no overlying edema, erythema, increase in warmth. Subjective he does get some pain in the back of his heel only points to the posterior inferior aspect of the heel where he get some pain during the day. MMT 5/5, ROM WNL.   Gait: Unassisted, Nonantalgic.     Assessment & Plan:  35 year old male with heel pain after standing all day at work -Treatment options discussed including all alternatives, risks, and complications -Etiology of symptoms were discussed -Declined x-ray -Discussed  stretching, icing exercises. Discussed with him shoe modification sent to purchase new shoes with a look for. Also discussed inserts to wear as well if needed but would start with shoe changes as his current shoe is worn out. He does not have any issues with sneakers.  Celesta Gentile, DPM

## 2016-01-18 ENCOUNTER — Other Ambulatory Visit: Payer: Self-pay | Admitting: Family Medicine

## 2016-01-18 ENCOUNTER — Telehealth: Payer: Self-pay | Admitting: Family Medicine

## 2016-01-18 DIAGNOSIS — M791 Myalgia, unspecified site: Secondary | ICD-10-CM

## 2016-01-18 DIAGNOSIS — R5383 Other fatigue: Secondary | ICD-10-CM

## 2016-01-18 DIAGNOSIS — M609 Myositis, unspecified: Secondary | ICD-10-CM

## 2016-01-18 DIAGNOSIS — E348 Other specified endocrine disorders: Secondary | ICD-10-CM

## 2016-01-18 DIAGNOSIS — E785 Hyperlipidemia, unspecified: Secondary | ICD-10-CM

## 2016-01-18 DIAGNOSIS — R739 Hyperglycemia, unspecified: Secondary | ICD-10-CM

## 2016-01-18 NOTE — Telephone Encounter (Signed)
Labs entered for lab appt on 01/22/16 Patient informed

## 2016-01-22 ENCOUNTER — Other Ambulatory Visit (INDEPENDENT_AMBULATORY_CARE_PROVIDER_SITE_OTHER): Payer: 59

## 2016-01-22 DIAGNOSIS — R5383 Other fatigue: Secondary | ICD-10-CM

## 2016-01-22 DIAGNOSIS — M791 Myalgia, unspecified site: Secondary | ICD-10-CM

## 2016-01-22 DIAGNOSIS — E348 Other specified endocrine disorders: Secondary | ICD-10-CM | POA: Diagnosis not present

## 2016-01-22 DIAGNOSIS — E291 Testicular hypofunction: Secondary | ICD-10-CM | POA: Diagnosis not present

## 2016-01-22 DIAGNOSIS — M609 Myositis, unspecified: Secondary | ICD-10-CM

## 2016-01-22 DIAGNOSIS — E785 Hyperlipidemia, unspecified: Secondary | ICD-10-CM | POA: Diagnosis not present

## 2016-01-22 DIAGNOSIS — R739 Hyperglycemia, unspecified: Secondary | ICD-10-CM

## 2016-01-22 LAB — LIPID PANEL
CHOLESTEROL: 184 mg/dL (ref 0–200)
HDL: 40.5 mg/dL (ref >39.00)
LDL Cholesterol: 135 mg/dL — ABNORMAL HIGH (ref 0–99)
NonHDL: 143.54
TRIGLYCERIDES: 44 mg/dL (ref 0.0–149.0)
Total CHOL/HDL Ratio: 5
VLDL: 8.8 mg/dL (ref 0.0–40.0)

## 2016-01-22 LAB — TESTOSTERONE: TESTOSTERONE: 364.79 ng/dL (ref 300.00–890.00)

## 2016-01-22 LAB — COMPREHENSIVE METABOLIC PANEL
ALBUMIN: 4.5 g/dL (ref 3.5–5.2)
ALK PHOS: 55 U/L (ref 39–117)
ALT: 28 U/L (ref 0–53)
AST: 28 U/L (ref 0–37)
BILIRUBIN TOTAL: 0.8 mg/dL (ref 0.2–1.2)
BUN: 16 mg/dL (ref 6–23)
CO2: 30 mEq/L (ref 19–32)
CREATININE: 1.13 mg/dL (ref 0.40–1.50)
Calcium: 9.8 mg/dL (ref 8.4–10.5)
Chloride: 103 mEq/L (ref 96–112)
GFR: 78.01 mL/min (ref >60.00)
Glucose, Bld: 84 mg/dL (ref 70–99)
Potassium: 4.5 mEq/L (ref 3.5–5.1)
SODIUM: 139 meq/L (ref 135–145)
TOTAL PROTEIN: 7.4 g/dL (ref 6.0–8.3)

## 2016-01-22 LAB — VITAMIN D 25 HYDROXY (VIT D DEFICIENCY, FRACTURES): VITD: 39.73 ng/mL (ref 30.00–100.00)

## 2016-01-22 NOTE — Addendum Note (Signed)
Addended by: Caffie Pinto on: 01/22/2016 08:21 AM   Modules accepted: Orders

## 2016-01-26 LAB — TESTOS,TOTAL,FREE AND SHBG (FEMALE)
Sex Hormone Binding Glob.: 28 nmol/L (ref 10–50)
Testosterone, Free: 66.7 pg/mL (ref 35.0–155.0)
Testosterone,Total,LC/MS/MS: 442 ng/dL (ref 250–1100)

## 2016-01-28 DIAGNOSIS — K219 Gastro-esophageal reflux disease without esophagitis: Secondary | ICD-10-CM | POA: Diagnosis not present

## 2016-01-28 DIAGNOSIS — Z6831 Body mass index (BMI) 31.0-31.9, adult: Secondary | ICD-10-CM | POA: Diagnosis not present

## 2016-01-28 DIAGNOSIS — Z0000 Encounter for general adult medical examination without abnormal findings: Secondary | ICD-10-CM | POA: Diagnosis not present

## 2016-01-28 DIAGNOSIS — E291 Testicular hypofunction: Secondary | ICD-10-CM | POA: Diagnosis not present

## 2016-01-28 DIAGNOSIS — E785 Hyperlipidemia, unspecified: Secondary | ICD-10-CM | POA: Diagnosis not present

## 2017-03-02 ENCOUNTER — Encounter: Payer: Self-pay | Admitting: Sports Medicine

## 2017-03-02 ENCOUNTER — Ambulatory Visit (INDEPENDENT_AMBULATORY_CARE_PROVIDER_SITE_OTHER): Payer: No Typology Code available for payment source | Admitting: Sports Medicine

## 2017-03-02 DIAGNOSIS — M5136 Other intervertebral disc degeneration, lumbar region: Secondary | ICD-10-CM | POA: Diagnosis not present

## 2017-03-02 DIAGNOSIS — M25511 Pain in right shoulder: Secondary | ICD-10-CM

## 2017-03-02 DIAGNOSIS — R29818 Other symptoms and signs involving the nervous system: Secondary | ICD-10-CM

## 2017-03-02 DIAGNOSIS — Z0000 Encounter for general adult medical examination without abnormal findings: Secondary | ICD-10-CM

## 2017-03-02 DIAGNOSIS — G8929 Other chronic pain: Secondary | ICD-10-CM | POA: Diagnosis not present

## 2017-03-02 DIAGNOSIS — G529 Cranial nerve disorder, unspecified: Secondary | ICD-10-CM

## 2017-03-02 DIAGNOSIS — D239 Other benign neoplasm of skin, unspecified: Secondary | ICD-10-CM | POA: Insufficient documentation

## 2017-03-02 DIAGNOSIS — D229 Melanocytic nevi, unspecified: Secondary | ICD-10-CM

## 2017-03-02 DIAGNOSIS — M51369 Other intervertebral disc degeneration, lumbar region without mention of lumbar back pain or lower extremity pain: Secondary | ICD-10-CM | POA: Insufficient documentation

## 2017-03-02 DIAGNOSIS — R51 Headache: Secondary | ICD-10-CM

## 2017-03-02 DIAGNOSIS — R519 Headache, unspecified: Secondary | ICD-10-CM

## 2017-03-02 NOTE — Assessment & Plan Note (Addendum)
Physical today, checking routine labs.

## 2017-03-02 NOTE — Assessment & Plan Note (Signed)
Return for excisional biopsy, 30-minute slot.

## 2017-03-02 NOTE — Progress Notes (Signed)
Subjective:    CC: Establish care.   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  Right shoulder pain: Present for years, he used to play baseball, pitcher, outfielder.  Pain is localized over the deltoid.  He did have an MRI several years ago that showed mostly rotator cuff tendinosis.  He has been moderately diligent with his exercises.  Low back pain: Moderate, persistent, worse with sitting, flexion, Valsalva, nothing radicular, no bowel or bladder dysfunction, saddle numbness, no constitutional symptoms.  Headaches: Moderate, intermittent, he describes them as migrainous but describes complete right-sided visual field loss in the right eye, but nothing homonymous.  Has a history of Bell's palsy.  Left-sided.  I reviewed the past medical history, family history, social history, surgical history, and allergies today and no changes were needed.  Please see the problem list section below in epic for further details.  Past Medical History: Past Medical History:  Diagnosis Date  . GERD (gastroesophageal reflux disease)    Past Surgical History: Past Surgical History:  Procedure Laterality Date  . APPENDECTOMY  09/2015  . LAPAROSCOPIC APPENDECTOMY N/A 09/25/2015   Procedure: APPENDECTOMY LAPAROSCOPIC;  Surgeon: Erroll Luna, MD;  Location: [facility];  Service: General;  Laterality: N/A;  . UMBILECTOMY    . UMBILICAL SURGERY     Social History: Social History   Socioeconomic History  . Marital status: Married    Spouse name: Levy Sjogren  . Number of children: 3  . Years of education: None  . Highest education level: Doctorate  Social Needs  . Financial resource strain: None  . Food insecurity - worry: None  . Food insecurity - inability: None  . Transportation needs - medical: None  . Transportation needs - non-medical: None  Occupational History  . Occupation: Marine scientist: [employer]  Tobacco Use  . Smoking status: Never  Smoker  . Smokeless tobacco: Never Used  Substance and Sexual Activity  . Alcohol use: No    Alcohol/week: 0.0 oz  . Drug use: No  . Sexual activity: None  Other Topics Concern  . None  Social History Narrative  . None   Family History: Family History  Problem Relation Age of Onset  . Hyperlipidemia Mother   . Cancer Mother        Breast  . Hyperlipidemia Father   . Diabetes Neg Hx   . Heart attack Neg Hx   . Hypertension Neg Hx   . Sudden death Neg Hx    Allergies: No Known Allergies Medications: See med rec.  Review of Systems: No headache, visual changes, nausea, vomiting, diarrhea, constipation, dizziness, abdominal pain, skin rash, fevers, chills, night sweats, swollen lymph nodes, weight loss, chest pain, body aches, joint swelling, muscle aches, shortness of breath, mood changes, visual or auditory hallucinations.  Objective:    General: Well Developed, well nourished, and in no acute distress.  Neuro: Alert and oriented x3, extra-ocular muscles intact, sensation grossly intact.  Cranial nerves II through VI intact, slight weakness of cranial nerve VII on the left, history of Bell's palsy.  Cranial nerves VIII and IX are intact, there is rightward deviation of the uvula.  Cranial nerves XI and XII intact. HEENT: Normocephalic, atraumatic, pupils equal round reactive to light, neck supple, no masses, no lymphadenopathy, thyroid nonpalpable.  Skin: Warm and dry, no rashes noted.  There is a 1.5 cm hyperpigmented lesion, regular in the right low back. Cardiac: Regular rate and  rhythm, no murmurs rubs or gallops.  Respiratory: Clear to auscultation bilaterally. Not using accessory muscles, speaking in full sentences.  Abdominal: Soft, nontender, nondistended, positive bowel sounds, no masses, no organomegaly.  Right shoulder: Inspection reveals no abnormalities, atrophy or asymmetry. Palpation is normal with no tenderness over AC joint or bicipital groove. ROM is full in  all planes. Rotator cuff strength normal throughout. Positive Neer and Hawkin's tests, empty can. Speeds and Yergason's tests normal. Minimally positive Obrien's, negative crank, negative clunk, and good stability. Normal scapular function observed. No painful arc and no drop arm sign. No apprehension sign Back Exam:  Inspection: Unremarkable  Motion: Flexion 45 deg, Extension 45 deg, Side Bending to 45 deg bilaterally,  Rotation to 45 deg bilaterally  SLR laying: Negative  XSLR laying: Negative  Palpable tenderness: None. FABER: negative. Sensory change: Gross sensation intact to all lumbar and sacral dermatomes.  Reflexes: 2+ at both patellar tendons, 2+ at achilles tendons, Babinski's downgoing.  Strength at foot  Plantar-flexion: 5/5 Dorsi-flexion: 5/5 Eversion: 5/5 Inversion: 5/5  Leg strength  Quad: 5/5 Hamstring: 5/5 Hip flexor: 5/5 Hip abductors: 5/5  Gait unremarkable.  Impression and Recommendations:    The patient was counselled, risk factors were discussed, anticipatory guidance given.  Annual physical exam Physical today, checking routine labs.  Chronic right shoulder pain Suspect labral injury and rotator cuff dysfunction. Rehab exercises given. If insufficient relief after 6 weeks we will proceed with an MR arthrogram, he has had a plain MRI.  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 Brain MRI with and without contrast.  Lumbar degenerative disc disease Moderate L4-L5 and L5-S1 degenerative disc disease on CT abdomen and pelvis. Herniated disc rehab exercises, pain is axial and discogenic.   Suspicious nevus Return for excisional biopsy, 30-minute slot.  ___________________________________________ Gwen Her. Dianah Field, M.D., ABFM., CAQSM. Primary Care and [place] Instructor of [place] [facility]

## 2017-03-02 NOTE — Assessment & Plan Note (Signed)
History of cranial nerve VII dysfunction, also has occasional episodes of right eye visual field loss, in addition there is rightward deviation of the uvula. Brain MRI with and without contrast.

## 2017-03-02 NOTE — Assessment & Plan Note (Signed)
Suspect labral injury and rotator cuff dysfunction. Rehab exercises given. If insufficient relief after 6 weeks we will proceed with an MR arthrogram, he has had a plain MRI.

## 2017-03-02 NOTE — Assessment & Plan Note (Signed)
Moderate L4-L5 and L5-S1 degenerative disc disease on CT abdomen and pelvis. Herniated disc rehab exercises, pain is axial and discogenic.

## 2017-03-10 ENCOUNTER — Other Ambulatory Visit: Payer: No Typology Code available for payment source

## 2017-03-10 ENCOUNTER — Other Ambulatory Visit: Payer: Self-pay | Admitting: Sports Medicine

## 2017-03-10 DIAGNOSIS — Z0000 Encounter for general adult medical examination without abnormal findings: Secondary | ICD-10-CM

## 2017-03-10 NOTE — Progress Notes (Signed)
Lab orders changed to [facility] per insurance

## 2017-03-11 ENCOUNTER — Other Ambulatory Visit: Payer: No Typology Code available for payment source

## 2017-03-12 LAB — LIPID PANEL
Chol/HDL Ratio: 4 ratio (ref 0.0–5.0)
Cholesterol, Total: 163 mg/dL (ref 100–199)
HDL: 41 mg/dL (ref >39)
LDL Calculated: 110 mg/dL — ABNORMAL HIGH (ref 0–99)
Triglycerides: 62 mg/dL (ref 0–149)
VLDL Cholesterol Cal: 12 mg/dL (ref 5–40)

## 2017-03-12 LAB — COMPREHENSIVE METABOLIC PANEL WITH GFR
Albumin: 4.3 g/dL (ref 3.5–5.5)
Alkaline Phosphatase: 61 IU/L (ref 39–117)
BUN/Creatinine Ratio: 9 (ref 9–20)
BUN: 9 mg/dL (ref 6–20)
Bilirubin Total: 0.6 mg/dL (ref 0.0–1.2)
Chloride: 103 mmol/L (ref 96–106)
GFR calc Af Amer: 104 mL/min/1.73 (ref >59)
Total Protein: 6.8 g/dL (ref 6.0–8.5)

## 2017-03-12 LAB — CBC WITH DIFFERENTIAL/PLATELET
Basophils Absolute: 0 x10E3/uL (ref 0.0–0.2)
Basos: 1 %
EOS (ABSOLUTE): 0.2 10*3/uL (ref 0.0–0.4)
Eos: 2 %
Hematocrit: 48.2 % (ref 37.5–51.0)
Hemoglobin: 16.2 g/dL (ref 13.0–17.7)
Immature Grans (Abs): 0 x10E3/uL (ref 0.0–0.1)
Immature Granulocytes: 0 %
Lymphocytes Absolute: 2.5 x10E3/uL (ref 0.7–3.1)
Lymphs: 37 %
MCH: 29.6 pg (ref 26.6–33.0)
MCHC: 33.6 g/dL (ref 31.5–35.7)
MCV: 88 fL (ref 79–97)
Monocytes Absolute: 0.8 10*3/uL (ref 0.1–0.9)
Monocytes: 11 %
Neutrophils Absolute: 3.4 x10E3/uL (ref 1.4–7.0)
Neutrophils: 49 %
Platelets: 219 10*3/uL (ref 150–379)
RBC: 5.48 x10E6/uL (ref 4.14–5.80)
RDW: 13.6 % (ref 12.3–15.4)
WBC: 6.9 10*3/uL (ref 3.4–10.8)

## 2017-03-12 LAB — COMPREHENSIVE METABOLIC PANEL
ALT: 26 IU/L (ref 0–44)
AST: 34 IU/L (ref 0–40)
Albumin/Globulin Ratio: 1.7 (ref 1.2–2.2)
CO2: 27 mmol/L (ref 20–29)
Calcium: 10.3 mg/dL — ABNORMAL HIGH (ref 8.7–10.2)
Creatinine, Ser: 1.05 mg/dL (ref 0.76–1.27)
GFR calc non Af Amer: 90 mL/min/{1.73_m2} (ref >59)
Globulin, Total: 2.5 g/dL (ref 1.5–4.5)
Glucose: 97 mg/dL (ref 65–99)
Potassium: 5.1 mmol/L (ref 3.5–5.2)
Sodium: 145 mmol/L — ABNORMAL HIGH (ref 134–144)

## 2017-03-12 LAB — TESTOSTERONE: Testosterone: 680 ng/dL (ref 264–916)

## 2017-03-12 LAB — HEMOGLOBIN A1C
Est. average glucose Bld gHb Est-mCnc: 103 mg/dL
Hgb A1c MFr Bld: 5.2 % (ref 4.8–5.6)

## 2017-03-12 LAB — TSH: TSH: 4.11 u[IU]/mL (ref 0.450–4.500)

## 2017-03-12 LAB — VITAMIN D 25 HYDROXY (VIT D DEFICIENCY, FRACTURES): Vit D, 25-Hydroxy: 45.5 ng/mL (ref 30.0–100.0)

## 2017-03-16 ENCOUNTER — Ambulatory Visit (INDEPENDENT_AMBULATORY_CARE_PROVIDER_SITE_OTHER): Payer: No Typology Code available for payment source | Admitting: Sports Medicine

## 2017-03-16 ENCOUNTER — Ambulatory Visit (INDEPENDENT_AMBULATORY_CARE_PROVIDER_SITE_OTHER): Payer: No Typology Code available for payment source

## 2017-03-16 ENCOUNTER — Encounter: Payer: Self-pay | Admitting: Sports Medicine

## 2017-03-16 VITALS — BP 115/80 | HR 80 | Resp 18 | Wt 225.0 lb

## 2017-03-16 DIAGNOSIS — G529 Cranial nerve disorder, unspecified: Secondary | ICD-10-CM

## 2017-03-16 DIAGNOSIS — J328 Other chronic sinusitis: Secondary | ICD-10-CM | POA: Diagnosis not present

## 2017-03-16 DIAGNOSIS — D229 Melanocytic nevi, unspecified: Secondary | ICD-10-CM

## 2017-03-16 DIAGNOSIS — R51 Headache: Secondary | ICD-10-CM | POA: Diagnosis not present

## 2017-03-16 DIAGNOSIS — R29818 Other symptoms and signs involving the nervous system: Secondary | ICD-10-CM | POA: Diagnosis not present

## 2017-03-16 DIAGNOSIS — Q309 Congenital malformation of nose, unspecified: Secondary | ICD-10-CM

## 2017-03-16 DIAGNOSIS — Q308 Other congenital malformations of nose: Secondary | ICD-10-CM | POA: Insufficient documentation

## 2017-03-16 DIAGNOSIS — R519 Headache, unspecified: Secondary | ICD-10-CM

## 2017-03-16 MED ORDER — GADOBENATE DIMEGLUMINE 529 MG/ML IV SOLN
20.0000 mL | Freq: Once | INTRAVENOUS | Status: AC | PRN
  Administered 2017-03-16: 20 mL via INTRAVENOUS

## 2017-03-16 NOTE — Progress Notes (Signed)
   Procedure:  Excision of 2.2cm right low back hyperpigmented skin lesion Risks, benefits, and alternatives explained and consent obtained. Time out conducted. Surface prepped with alcohol. 10cc lidocaine with epinephine infiltrated in a field block. Adequate anesthesia ensured. Area prepped and draped in a sterile fashion. Excision performed with: I made a elliptical incision with a #15 blade, using both sharp and blunt dissection I carried the procedure down to the subcutaneous tissues and removed the skin lesion en bloc, I placed a Ethilon suture at the lateral pole. Hemostasis achieved. Pt stable.

## 2017-03-16 NOTE — Assessment & Plan Note (Signed)
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

## 2017-03-16 NOTE — Patient Instructions (Signed)
Incision Care, Adult An incision is a surgical cut that is made through your skin. Most incisions are closed after surgery. Your incision may be closed with stitches (sutures), staples, skin glue, or adhesive strips. You may need to return to your health care provider to have sutures or staples removed. This may occur several days to several weeks after your surgery. The incision needs to be cared for properly to prevent infection. How to care for your incision Incision care   Follow instructions from your health care provider about how to take care of your incision. Make sure you: ? Wash your hands with soap and water before you change the bandage (dressing). If soap and water are not available, use hand sanitizer. ? Change your dressing as told by your health care provider. ? Leave sutures, skin glue, or adhesive strips in place. These skin closures may need to stay in place for 2 weeks or longer. If adhesive strip edges start to loosen and curl up, you may trim the loose edges. Do not remove adhesive strips completely unless your health care provider tells you to do that.  Check your incision area every day for signs of infection. Check for: ? More redness, swelling, or pain. ? More fluid or blood. ? Warmth. ? Pus or a bad smell.  Ask your health care provider how to clean the incision. This may include: ? Using mild soap and water. ? Using a clean towel to pat the incision dry after cleaning it. ? Applying a cream or ointment. Do this only as told by your health care provider. ? Covering the incision with a clean dressing.  Ask your health care provider when you can leave the incision uncovered.  Do not take baths, swim, or use a hot tub until your health care provider approves. Ask your health care provider if you can take showers. You may only be allowed to take sponge baths for bathing. Medicines  If you were prescribed an antibiotic medicine, cream, or ointment, take or apply the  antibiotic as told by your health care provider. Do not stop taking or applying the antibiotic even if your condition improves.  Take over-the-counter and prescription medicines only as told by your health care provider. General instructions  Limit movement around your incision to improve healing. ? Avoid straining, lifting, or exercise for the first month, or for as long as told by your health care provider. ? Follow instructions from your health care provider about returning to your normal activities. ? Ask your health care provider what activities are safe.  Protect your incision from the sun when you are outside for the first 6 months, or for as long as told by your health care provider. Apply sunscreen around the scar or cover it up.  Keep all follow-up visits as told by your health care provider. This is important. Contact a health care provider if:  Your have more redness, swelling, or pain around the incision.  You have more fluid or blood coming from the incision.  Your incision feels warm to the touch.  You have pus or a bad smell coming from the incision.  You have a fever or shaking chills.  You are nauseous or you vomit.  You are dizzy.  Your sutures or staples come undone. Get help right away if:  You have a red streak coming from your incision.  Your incision bleeds through the dressing and the bleeding does not stop with gentle pressure.  The edges of   your incision open up and separate.  You have severe pain.  You have a rash.  You are confused.  You faint.  You have trouble breathing and a fast heartbeat. This information is not intended to replace advice given to you by your health care provider. Make sure you discuss any questions you have with your health care provider. Document Released: 08/16/2004 Document Revised: 10/05/2015 Document Reviewed: 08/15/2015 Elsevier Interactive Patient Education  2018 Elsevier Inc.  

## 2017-03-16 NOTE — Assessment & Plan Note (Signed)
Surgical excision, return in a week and a half for suture removal and wound check. Awaiting Derm path results.

## 2017-03-20 ENCOUNTER — Telehealth: Payer: Self-pay

## 2017-03-20 MED ORDER — FLUTICASONE PROPIONATE 50 MCG/ACT NA SUSP: NASAL | 3 refills | Status: DC

## 2017-03-20 NOTE — Telephone Encounter (Signed)
Sent in

## 2017-03-20 NOTE — Telephone Encounter (Signed)
Spoke with pt and would like to know if he can give flonase a try to open up his sinuses while he awaits ENT appointment? Please advise.

## 2017-03-24 ENCOUNTER — Encounter: Payer: Self-pay | Admitting: Sports Medicine

## 2017-03-24 DIAGNOSIS — E7841 Elevated Lipoprotein(a): Secondary | ICD-10-CM

## 2017-03-25 ENCOUNTER — Other Ambulatory Visit: Payer: Self-pay | Admitting: Sports Medicine

## 2017-03-25 ENCOUNTER — Other Ambulatory Visit (INDEPENDENT_AMBULATORY_CARE_PROVIDER_SITE_OTHER): Payer: No Typology Code available for payment source

## 2017-03-25 DIAGNOSIS — E782 Mixed hyperlipidemia: Secondary | ICD-10-CM

## 2017-03-26 ENCOUNTER — Encounter: Payer: Self-pay | Admitting: Sports Medicine

## 2017-03-30 LAB — NMR, LIPOPROFILE
Cholesterol, Total: 215 mg/dL — ABNORMAL HIGH (ref 100–199)
HDL Particle Number: 24.5 umol/L — ABNORMAL LOW (ref >=30.5)
HDL-C: 37 mg/dL — ABNORMAL LOW (ref >39)
LDL Particle Number: 2007 nmol/L — ABNORMAL HIGH (ref <1000)
LDL Size: 21.1 nm (ref >20.5)
LDL-C: 165 mg/dL — ABNORMAL HIGH (ref 0–99)
LP-IR Score: 52 — ABNORMAL HIGH (ref <=45)
Small LDL Particle Number: 559 nmol/L — ABNORMAL HIGH (ref <=527)
Triglycerides: 66 mg/dL (ref 0–149)

## 2017-04-10 DIAGNOSIS — E782 Mixed hyperlipidemia: Secondary | ICD-10-CM | POA: Insufficient documentation

## 2017-04-10 MED ORDER — OMEGA-3-ACID ETHYL ESTERS 1 G PO CAPS: 2.0000 g | ORAL_CAPSULE | Freq: Two times a day (BID) | ORAL | 3 refills | Status: DC

## 2017-04-10 MED ORDER — ROSUVASTATIN CALCIUM 10 MG PO TABS: 10.0000 mg | ORAL_TABLET | Freq: Every day | ORAL | 3 refills | Status: DC

## 2017-04-10 NOTE — Assessment & Plan Note (Signed)
After long discussion with been we have decided to do low-dose 10 mg Crestor, which is free for him, as well as Lovaza. We can recheck his lipids in 3 months.

## 2017-04-10 NOTE — Addendum Note (Signed)
Addended by: Silverio Decamp on: 04/10/2017 08:32 AM   Modules accepted: Orders

## 2017-04-13 ENCOUNTER — Encounter: Payer: Self-pay | Admitting: Sports Medicine

## 2017-04-13 ENCOUNTER — Ambulatory Visit (INDEPENDENT_AMBULATORY_CARE_PROVIDER_SITE_OTHER): Payer: No Typology Code available for payment source | Admitting: Sports Medicine

## 2017-04-13 DIAGNOSIS — M25511 Pain in right shoulder: Secondary | ICD-10-CM | POA: Diagnosis not present

## 2017-04-13 DIAGNOSIS — G8929 Other chronic pain: Secondary | ICD-10-CM

## 2017-04-13 DIAGNOSIS — B351 Tinea unguium: Secondary | ICD-10-CM | POA: Insufficient documentation

## 2017-04-13 DIAGNOSIS — E782 Mixed hyperlipidemia: Secondary | ICD-10-CM | POA: Diagnosis not present

## 2017-04-13 MED ORDER — TERBINAFINE HCL 250 MG PO TABS: 250.0000 mg | ORAL_TABLET | Freq: Every day | ORAL | 2 refills | Status: DC

## 2017-04-13 NOTE — Assessment & Plan Note (Signed)
Has cut back on overhead press. At the last visit I suspected labral injury and rotator cuff dysfunction, this has since resolved. If this recurs we will proceed with an MR arthrogram. Continue rehab exercises for now.

## 2017-04-13 NOTE — Assessment & Plan Note (Signed)
Failure of Jublia sometime ago. Starting Lamisil for 6-9 months. Return to see me in 3 months.  Checking LFTs in 2 months.

## 2017-04-13 NOTE — Assessment & Plan Note (Signed)
Currently on Crestor and Lovaza per his request. In 2 months we will recheck his LFTs, lipid panel, and NMR LipoProfile.

## 2017-04-13 NOTE — Progress Notes (Signed)
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  His shoulder has improved considerably with home rehab exercises and avoiding overhead press.  Onychomycosis: Present for many years, failed Jublia in the past.  Hyperlipidemia: Started Crestor and Lovaza.  No adverse effects.  I reviewed the past medical history, family history, social history, surgical history, and allergies today and no changes were needed.  Please see the problem list section below in epic for further details.  Past Medical History: Past Medical History:  Diagnosis Date  . GERD (gastroesophageal reflux disease)    Past Surgical History: Past Surgical History:  Procedure Laterality Date  . APPENDECTOMY  09/2015  . LAPAROSCOPIC APPENDECTOMY N/A 09/25/2015   Procedure: APPENDECTOMY LAPAROSCOPIC;  Surgeon: Erroll Luna, MD;  Location: [facility];  Service: General;  Laterality: N/A;  . UMBILECTOMY    . UMBILICAL SURGERY     Social History: Social History   Socioeconomic History  . Marital status: Married    Spouse name: Levy Sjogren  . Number of children: 3  . Years of education: None  . Highest education level: Doctorate  Social Needs  . Financial resource strain: None  . Food insecurity - worry: None  . Food insecurity - inability: None  . Transportation needs - medical: None  . Transportation needs - non-medical: None  Occupational History  . Occupation: Marine scientist:   Tobacco Use  . Smoking status: Never Smoker  . Smokeless tobacco: Never Used  Substance and Sexual Activity  . Alcohol use: No    Alcohol/week: 0.0 oz  . Drug use: No  . Sexual activity: None  Other Topics Concern  . None  Social History Narrative  . None   Family History: Family History  Problem Relation Age of Onset  . Hyperlipidemia Mother   . Cancer Mother        Breast  . Hyperlipidemia Father   . Diabetes Neg Hx   . Heart attack Neg Hx    . Hypertension Neg Hx   . Sudden death Neg Hx    Allergies: No Known Allergies Medications: See med rec.  Review of Systems: No fevers, chills, night sweats, weight loss, chest pain, or shortness of breath.   Objective:    General: Well Developed, well nourished, and in no acute distress.  Neuro: Alert and oriented x3, extra-ocular muscles intact, sensation grossly intact.  HEENT: Normocephalic, atraumatic, pupils equal round reactive to light, neck supple, no masses, no lymphadenopathy, thyroid nonpalpable.  Skin: Warm and dry, no rashes.  Onychomycosis on the second and third toenails on both feet.  Thickened, yellow.  Mild onychodystrophy. Cardiac: Regular rate and rhythm, no murmurs rubs or gallops, no lower extremity edema.  Respiratory: Clear to auscultation bilaterally. Not using accessory muscles, speaking in full sentences.  Impression and Recommendations:    Mixed hyperlipidemia Currently on Crestor and Lovaza per his request. In 2 months we will recheck his LFTs, lipid panel, and NMR LipoProfile.  Onychomycosis Failure of Jublia sometime ago. Starting Lamisil for 6-9 months. Return to see me in 3 months.  Checking LFTs in 2 months.  Chronic right shoulder pain Has cut back on overhead press. At the last visit I suspected labral injury and rotator cuff dysfunction, this has since resolved. If this recurs we will proceed with an MR arthrogram. Continue rehab exercises for now.  I spent 25 minutes with this patient, greater than 50% was  face-to-face time counseling regarding the above diagnoses ___________________________________________ Gwen Her. Dianah Field, M.D., ABFM., CAQSM. Primary Care and [place] Instructor of [place] of [facility] of Medicine

## 2017-06-15 ENCOUNTER — Encounter: Payer: Self-pay | Admitting: Sports Medicine

## 2017-06-17 ENCOUNTER — Other Ambulatory Visit: Payer: No Typology Code available for payment source

## 2017-06-18 LAB — NMR, LIPOPROFILE
Cholesterol, Total: 139 mg/dL (ref 100–199)
HDL Particle Number: 28.8 umol/L — ABNORMAL LOW (ref >=30.5)
HDL-C: 40 mg/dL (ref >39)
LDL Particle Number: 1038 nmol/L — ABNORMAL HIGH (ref <1000)
LDL Size: 20.8 nm (ref >20.5)
LDL-C: 84 mg/dL (ref 0–99)
LP-IR Score: 46 — ABNORMAL HIGH (ref <=45)
Small LDL Particle Number: 482 nmol/L (ref <=527)
Triglycerides: 75 mg/dL (ref 0–149)

## 2017-06-18 LAB — COMPREHENSIVE METABOLIC PANEL WITH GFR
Albumin: 4.5 g/dL (ref 3.5–5.5)
BUN/Creatinine Ratio: 12 (ref 9–20)
BUN: 14 mg/dL (ref 6–20)
Creatinine, Ser: 1.14 mg/dL (ref 0.76–1.27)
GFR calc Af Amer: 94 mL/min/1.73 (ref >59)
Glucose: 81 mg/dL (ref 65–99)
Total Protein: 7 g/dL (ref 6.0–8.5)

## 2017-06-18 LAB — COMPREHENSIVE METABOLIC PANEL
ALT: 50 IU/L — ABNORMAL HIGH (ref 0–44)
AST: 59 IU/L — ABNORMAL HIGH (ref 0–40)
Albumin/Globulin Ratio: 1.8 (ref 1.2–2.2)
Alkaline Phosphatase: 60 IU/L (ref 39–117)
Bilirubin Total: 0.6 mg/dL (ref 0.0–1.2)
CO2: 25 mmol/L (ref 20–29)
Calcium: 9.5 mg/dL (ref 8.7–10.2)
Chloride: 100 mmol/L (ref 96–106)
GFR calc non Af Amer: 82 mL/min/{1.73_m2} (ref >59)
Globulin, Total: 2.5 g/dL (ref 1.5–4.5)
Potassium: 5 mmol/L (ref 3.5–5.2)
Sodium: 140 mmol/L (ref 134–144)

## 2017-06-18 LAB — LIPID PANEL
Chol/HDL Ratio: 3.3 ratio (ref 0.0–5.0)
Cholesterol, Total: 129 mg/dL (ref 100–199)
HDL: 39 mg/dL — ABNORMAL LOW (ref >39)
LDL Calculated: 76 mg/dL (ref 0–99)
Triglycerides: 70 mg/dL (ref 0–149)
VLDL Cholesterol Cal: 14 mg/dL (ref 5–40)

## 2017-07-13 ENCOUNTER — Ambulatory Visit (INDEPENDENT_AMBULATORY_CARE_PROVIDER_SITE_OTHER): Payer: No Typology Code available for payment source | Admitting: Sports Medicine

## 2017-07-13 ENCOUNTER — Encounter: Payer: Self-pay | Admitting: Sports Medicine

## 2017-07-13 DIAGNOSIS — Z125 Encounter for screening for malignant neoplasm of prostate: Secondary | ICD-10-CM | POA: Diagnosis not present

## 2017-07-13 DIAGNOSIS — B351 Tinea unguium: Secondary | ICD-10-CM

## 2017-07-13 DIAGNOSIS — R635 Abnormal weight gain: Secondary | ICD-10-CM | POA: Insufficient documentation

## 2017-07-13 MED ORDER — PHENTERMINE HCL 37.5 MG PO TABS: 18.7500 mg | ORAL_TABLET | Freq: Every day | ORAL | 0 refills | Status: DC

## 2017-07-13 NOTE — Assessment & Plan Note (Signed)
Starting low-dose phentermine, return in 3 months.

## 2017-07-13 NOTE — Assessment & Plan Note (Signed)
2.5 times average risk on a 23andme genetic test. Adding a PSA.

## 2017-07-13 NOTE — Progress Notes (Signed)
Subjective:    CC: Recheck toes  HPI: Onychomycosis: At this point has only been on Lamisil for 3 months, not yet seen much of a difference.  Prostate cancer screening: Understands that we do not generally do PSA screening for healthy patients, he does not have a strong family history but he did a commercial genetic test that showed a 2.5 times risk of prostate cancer and would like a PSA done.  Abnormal weight gain: Would like to try phentermine again.  I reviewed the past medical history, family history, social history, surgical history, and allergies today and no changes were needed.  Please see the problem list section below in epic for further details.  Past Medical History: Past Medical History:  Diagnosis Date  . GERD (gastroesophageal reflux disease)    Past Surgical History: Past Surgical History:  Procedure Laterality Date  . APPENDECTOMY  09/2015  . LAPAROSCOPIC APPENDECTOMY N/A 09/25/2015   Procedure: APPENDECTOMY LAPAROSCOPIC;  Surgeon: Erroll Luna, MD;  Location: [place];  Service: General;  Laterality: N/A;  . UMBILECTOMY    . UMBILICAL SURGERY     Social History: Social History   Socioeconomic History  . Marital status: Married    Spouse name: Levy Sjogren  . Number of children: 3  . Years of education: Not on file  . Highest education level: Doctorate  Occupational History  . Occupation: Marine scientist: [employer]  . Financial resource strain: Not on file  . Food insecurity:    Worry: Not on file    Inability: Not on file  . Transportation needs:    Medical: Not on file    Non-medical: Not on file  Tobacco Use  . Smoking status: Never Smoker  . Smokeless tobacco: Never Used  Substance and Sexual Activity  . Alcohol use: No    Alcohol/week: 0.0 oz  . Drug use: No  . Sexual activity: Not on file  Lifestyle  . Physical activity:    Days per week: Not on file    Minutes per session: Not on file  . Stress: Not on file    Relationships  . Social connections:    Talks on phone: Not on file    Gets together: Not on file    Attends religious service: Not on file    Active member of club or organization: Not on file    Attends meetings of clubs or organizations: Not on file    Relationship status: Not on file  Other Topics Concern  . Not on file  Social History Narrative  . Not on file   Family History: Family History  Problem Relation Age of Onset  . Hyperlipidemia Mother   . Cancer Mother        Breast  . Hyperlipidemia Father   . Diabetes Neg Hx   . Heart attack Neg Hx   . Hypertension Neg Hx   . Sudden death Neg Hx    Allergies: No Known Allergies Medications: See med rec.  Review of Systems: No fevers, chills, night sweats, weight loss, chest pain, or shortness of breath.   Objective:    General: Well Developed, well nourished, and in no acute distress.  Neuro: Alert and oriented x3, extra-ocular muscles intact, sensation grossly intact.  HEENT: Normocephalic, atraumatic, pupils equal round reactive to light, neck supple, no masses, no lymphadenopathy, thyroid nonpalpable.  Skin: Warm and dry, no rashes. Cardiac: Regular rate and rhythm, no murmurs rubs or gallops, no lower  extremity edema.  Respiratory: Clear to auscultation bilaterally. Not using accessory muscles, speaking in full sentences.  Impression and Recommendations:    Onychomycosis May be slight improvement in 1 of his toenails but overall looks the same after 3 months. Continue Lamisil, we did discuss that this could take a total of 6 to 9 months of treatment. Rechecking LFTs, he did have slight transaminitis at the last visit but we also added a statin. Hepatic steatosis would also be on the differential. Ultrasound if still elevated.  Abnormal weight gain Starting low-dose phentermine, return in 3 months.  Screening for malignant neoplasm of prostate 2.5 times average risk on a 23andme genetic test. Adding a  PSA. ___________________________________________ Gwen Her. Dianah Field, M.D., ABFM., CAQSM. Primary Care and [place] Instructor of [place] of [facility] of Medicine

## 2017-07-13 NOTE — Assessment & Plan Note (Signed)
May be slight improvement in 1 of his toenails but overall looks the same after 3 months. Continue Lamisil, we did discuss that this could take a total of 6 to 9 months of treatment. Rechecking LFTs, he did have slight transaminitis at the last visit but we also added a statin. Hepatic steatosis would also be on the differential. Ultrasound if still elevated.

## 2017-07-14 LAB — COMPREHENSIVE METABOLIC PANEL
AG Ratio: 1.9 (calc) (ref 1.0–2.5)
Albumin: 4.7 g/dL (ref 3.6–5.1)
CO2: 31 mmol/L (ref 20–32)
Chloride: 106 mmol/L (ref 98–110)
Globulin: 2.5 g/dL (calc) (ref 1.9–3.7)
Glucose, Bld: 87 mg/dL (ref 65–139)
Potassium: 4.7 mmol/L (ref 3.5–5.3)
Sodium: 143 mmol/L (ref 135–146)
Total Protein: 7.2 g/dL (ref 6.1–8.1)

## 2017-07-14 LAB — COMPREHENSIVE METABOLIC PANEL WITH GFR
ALT: 26 U/L (ref 9–46)
AST: 34 U/L (ref 10–40)
Alkaline phosphatase (APISO): 50 U/L (ref 40–115)
BUN: 20 mg/dL (ref 7–25)
Calcium: 9.9 mg/dL (ref 8.6–10.3)
Creat: 1.22 mg/dL (ref 0.60–1.35)
Total Bilirubin: 0.5 mg/dL (ref 0.2–1.2)

## 2017-07-14 LAB — PSA, TOTAL AND FREE
PSA, % Free: 40 % (ref >25)
PSA, Free: 0.2 ng/mL
PSA, Total: 0.5 ng/mL (ref <=4.0)

## 2017-10-06 ENCOUNTER — Other Ambulatory Visit: Payer: Self-pay | Admitting: *Deleted

## 2017-10-06 MED ORDER — CLOMIPHENE CITRATE 50 MG PO TABS: ORAL_TABLET | ORAL | 1 refills | Status: DC

## 2017-10-06 MED ORDER — RANITIDINE HCL 150 MG PO CAPS: 150.0000 mg | ORAL_CAPSULE | Freq: Two times a day (BID) | ORAL | 3 refills | Status: DC

## 2017-10-19 ENCOUNTER — Ambulatory Visit: Payer: No Typology Code available for payment source | Admitting: Sports Medicine

## 2017-12-08 ENCOUNTER — Encounter: Payer: Self-pay | Admitting: Sports Medicine

## 2017-12-08 MED ORDER — FAMOTIDINE 20 MG PO TABS: 20.0000 mg | ORAL_TABLET | Freq: Two times a day (BID) | ORAL | 11 refills | Status: DC

## 2017-12-26 IMAGING — CT CT ABD-PELV W/ CM
2 of 4 series · 16 of 46 positions shown, 18 images · IV contrast (APPLIED)
Comparison: None.

ADDENDUM:
These results will be called to the ordering clinician or
representative by the Radiologist Assistant, and communication
documented in the PACS or zVision Dashboard.
CLINICAL DATA: bloating and right lower quadrant pain

EXAM:
CT ABDOMEN AND PELVIS WITH CONTRAST
TECHNIQUE: Multidetector CT imaging of the abdomen and pelvis was performed
using the standard protocol following bolus administration of
intravenous contrast.
CONTRAST:  100mL 6EMRZ5-9FF IOPAMIDOL (6EMRZ5-9FF) INJECTION 61%

[Series 2: axial st · axial · 0.87mm/px · z∈[-579,-139]mm · 13 of 96 slices shown, 15 images]
[im 4/96  soft-tissue]
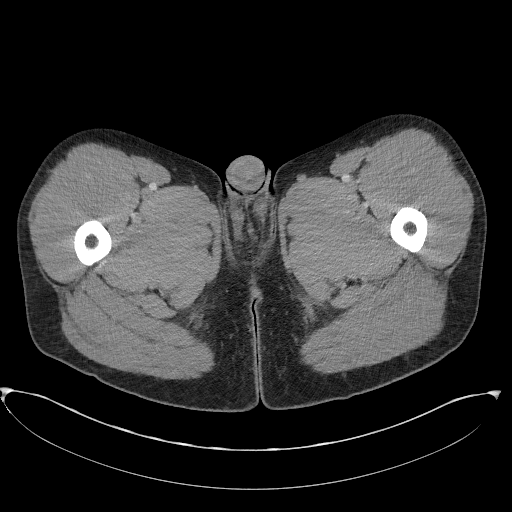
[im 4/96  bone]
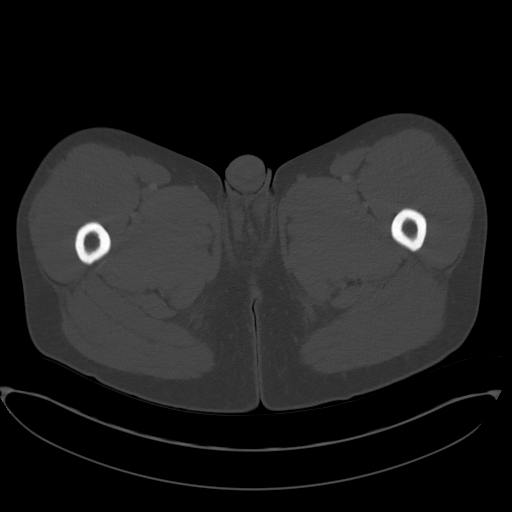
[im 12/96  soft-tissue]
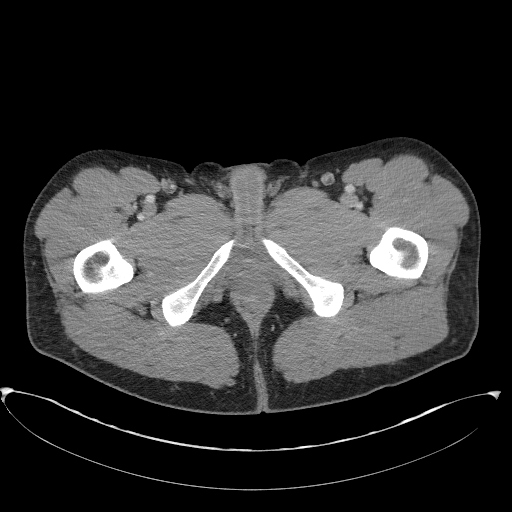
[im 20/96  soft-tissue]
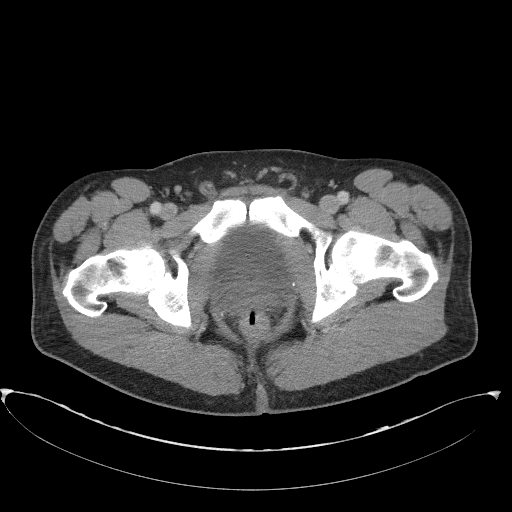
[im 27/96  soft-tissue]
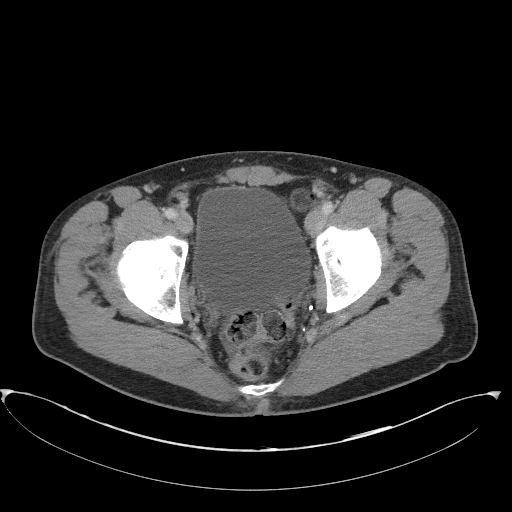
[im 35/96  soft-tissue]
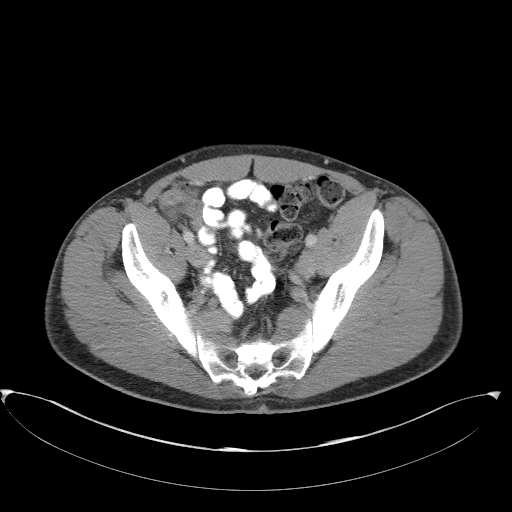
[im 42/96  soft-tissue]
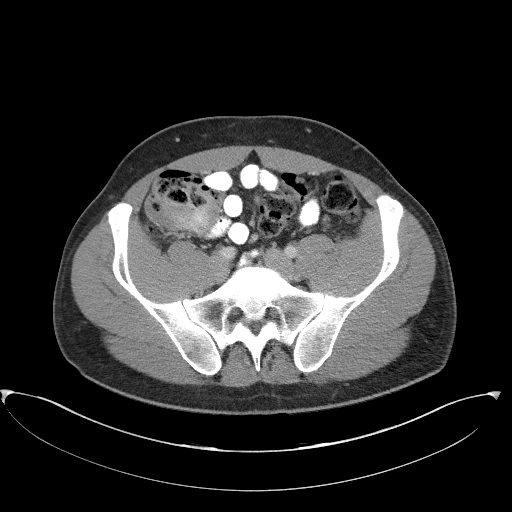
[im 50/96  soft-tissue]
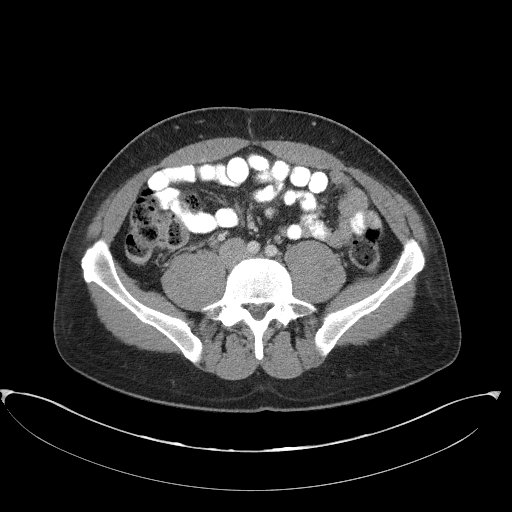
[im 54/96  soft-tissue]
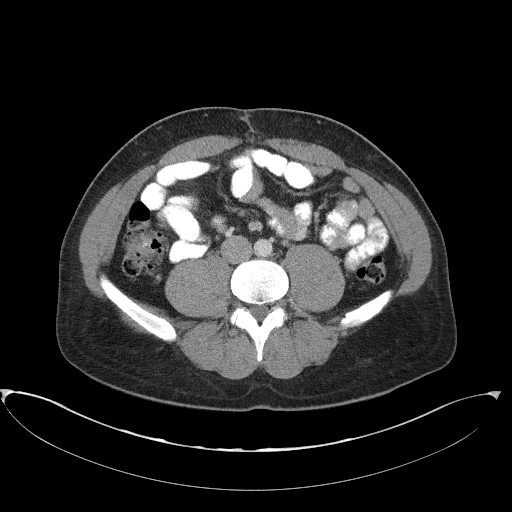
[im 61/96  soft-tissue]
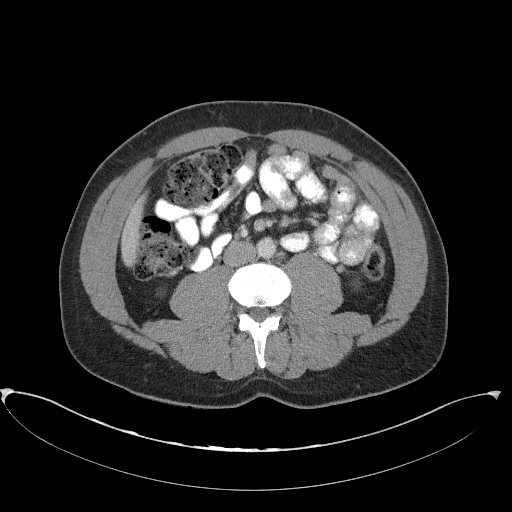
[im 61/96  bone]
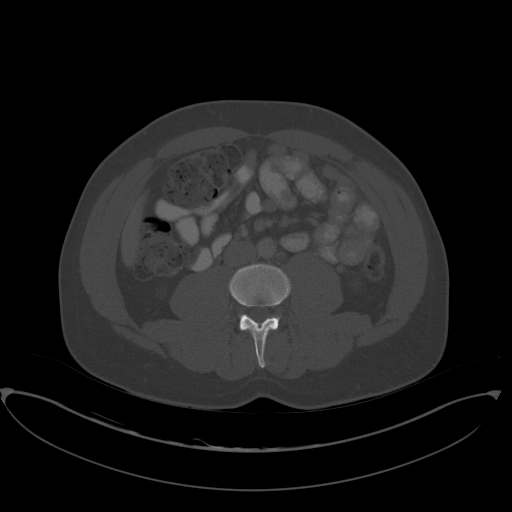
[im 69/96  soft-tissue]
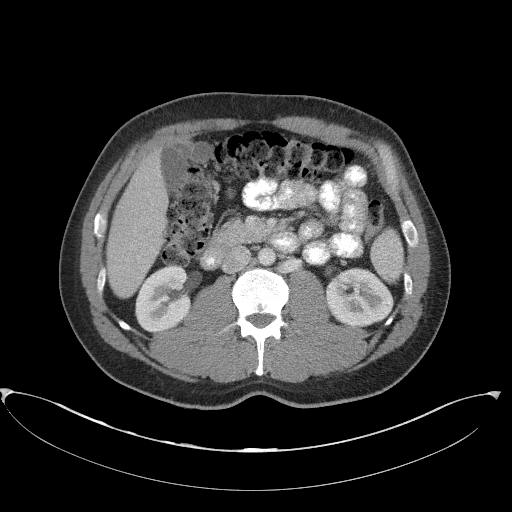
[im 77/96  soft-tissue]
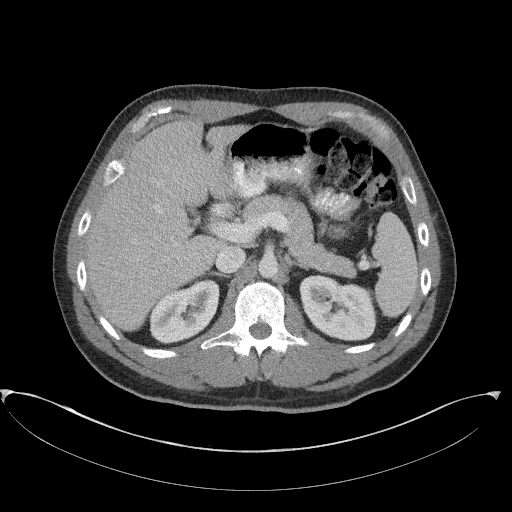
[im 84/96  soft-tissue]
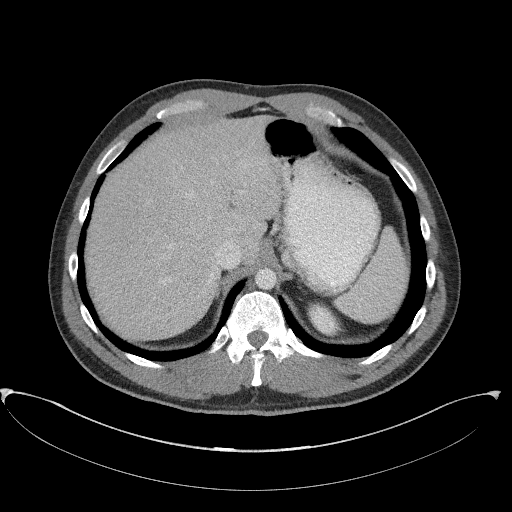
[im 92/96  soft-tissue]
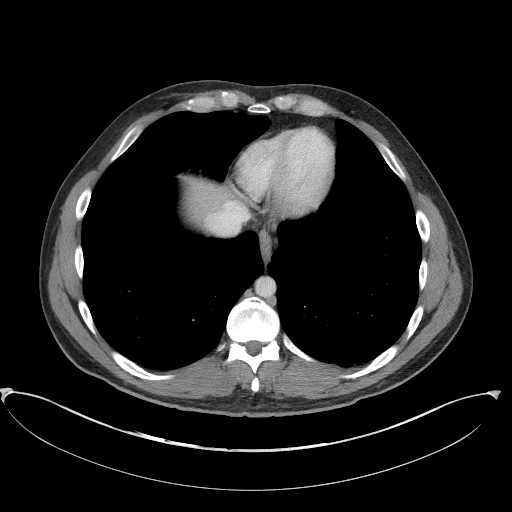

[Series 5: coronal st · coronal · 0.86mm/px · 3 of 93 slices shown]
[im 31/93  soft-tissue]
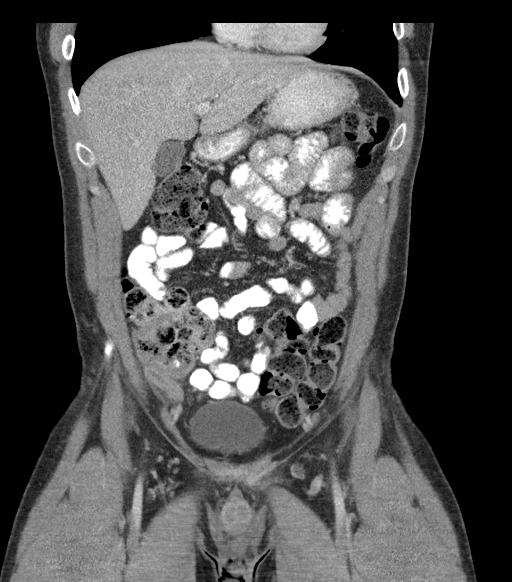
[im 41/93  soft-tissue]
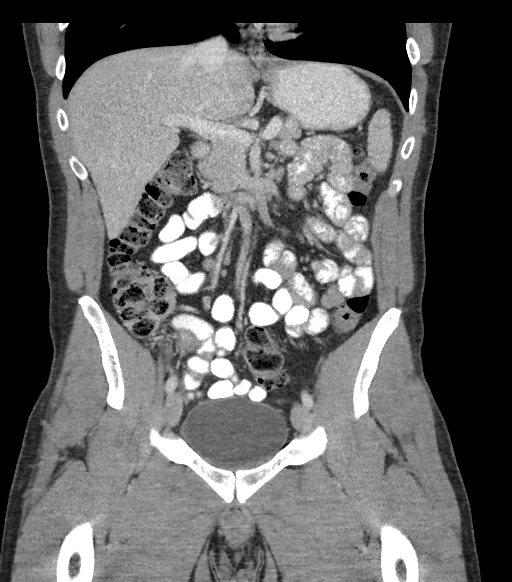
[im 52/93  soft-tissue]
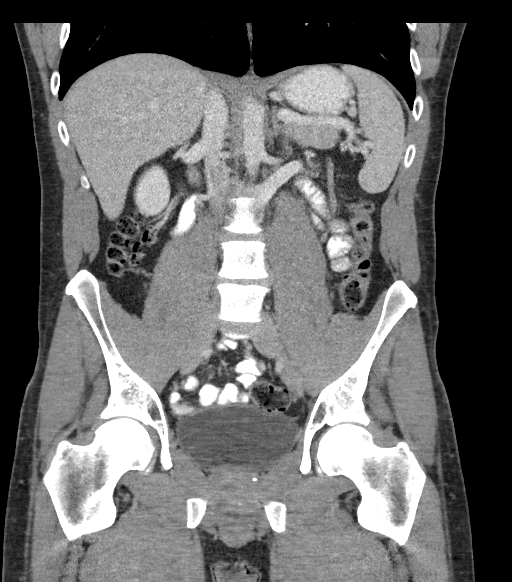

[16 of 46 positions shown; findings below may reference images not displayed]

FINDINGS: Lower chest:  No pulmonary nodules or masses.  No pleural effusion.

Hepatobiliary: No focal liver lesions. No biliary dilatation. The
gallbladder is normal.

Pancreas: Normal.  No peri-pancreatic fluid collection.

Spleen: Normal

Adrenals/Urinary Tract: Adrenal glands are normal. The kidneys are
normal. No hydronephrosis.

Stomach/Bowel: There is wall thickening of the appendix, which is
mildly dilated, measuring 8 mm. There is surrounding inflammatory
stranding with trace adjacent free fluid. No evidence of abscess
formation or perforation. No dilated loops of bowel. Moderate stool
throughout colon.

Vascular/Lymphatic: The inferior vena cava, portal vein, splenic
vein and superior mesenteric vein are patent. The aorta, bilateral
renal arteries, celiac axis and superior mesenteric artery are
patent. The visualized iliac vessels are normal. No retroperitoneal,
mesenteric or pelvic adenopathy.

Reproductive: Trace amount of fluid in the pelvis. Normal prostate
and seminal vesicles.

Other: None

Musculoskeletal: No lytic or blastic osseous lesions.
IMPRESSION: Acute appendicitis without evidence of abscess formation or
perforation.

## 2018-01-08 ENCOUNTER — Encounter: Payer: Self-pay | Admitting: Sports Medicine

## 2018-02-08 ENCOUNTER — Other Ambulatory Visit: Payer: Self-pay | Admitting: Sports Medicine

## 2018-02-08 DIAGNOSIS — B351 Tinea unguium: Secondary | ICD-10-CM

## 2018-06-02 ENCOUNTER — Other Ambulatory Visit: Payer: Self-pay | Admitting: Sports Medicine

## 2018-06-02 NOTE — Telephone Encounter (Addendum)
I am refilling Ben's Flonase but would you have him do a virtual/MyChart visit to catch up on things since I have not seen him in almost a year.  (I think we can do MyChart since Epic finally fixed things)

## 2018-06-03 ENCOUNTER — Telehealth (INDEPENDENT_AMBULATORY_CARE_PROVIDER_SITE_OTHER): Payer: No Typology Code available for payment source | Admitting: Sports Medicine

## 2018-06-03 ENCOUNTER — Telehealth: Payer: Self-pay | Admitting: Sports Medicine

## 2018-06-03 ENCOUNTER — Encounter: Payer: Self-pay | Admitting: Sports Medicine

## 2018-06-03 DIAGNOSIS — Z0000 Encounter for general adult medical examination without abnormal findings: Secondary | ICD-10-CM

## 2018-06-03 DIAGNOSIS — E782 Mixed hyperlipidemia: Secondary | ICD-10-CM | POA: Diagnosis not present

## 2018-06-03 DIAGNOSIS — E8881 Metabolic syndrome: Secondary | ICD-10-CM | POA: Diagnosis not present

## 2018-06-03 DIAGNOSIS — B351 Tinea unguium: Secondary | ICD-10-CM

## 2018-06-03 DIAGNOSIS — R635 Abnormal weight gain: Secondary | ICD-10-CM

## 2018-06-03 DIAGNOSIS — Z125 Encounter for screening for malignant neoplasm of prostate: Secondary | ICD-10-CM

## 2018-06-03 DIAGNOSIS — W57XXXA Bitten or stung by nonvenomous insect and other nonvenomous arthropods, initial encounter: Secondary | ICD-10-CM

## 2018-06-03 MED ORDER — DOXYCYCLINE HYCLATE 100 MG PO TABS: 100.0000 mg | ORAL_TABLET | Freq: Two times a day (BID) | ORAL | 0 refills | Status: AC

## 2018-06-03 MED ORDER — PHENTERMINE HCL 37.5 MG PO TABS: 18.7500 mg | ORAL_TABLET | Freq: Every day | ORAL | 0 refills | Status: DC

## 2018-06-03 MED ORDER — SEMAGLUTIDE (1 MG/DOSE) 2 MG/1.5ML ~~LOC~~ SOPN: 1.0000 mg | PEN_INJECTOR | SUBCUTANEOUS | 11 refills | Status: DC

## 2018-06-03 MED ORDER — SEMAGLUTIDE(0.25 OR 0.5MG/DOS) 2 MG/1.5ML ~~LOC~~ SOPN: 0.5000 mg | PEN_INJECTOR | SUBCUTANEOUS | 1 refills | Status: DC

## 2018-06-03 MED ORDER — CLOMIPHENE CITRATE 50 MG PO TABS: ORAL_TABLET | ORAL | 1 refills | Status: DC

## 2018-06-03 MED ORDER — METFORMIN HCL 500 MG PO TABS: 500.0000 mg | ORAL_TABLET | Freq: Two times a day (BID) | ORAL | 3 refills | Status: DC

## 2018-06-03 MED ORDER — EFINACONAZOLE 10 % EX SOLN: CUTANEOUS | 11 refills | Status: DC

## 2018-06-03 MED ORDER — ROSUVASTATIN CALCIUM 10 MG PO TABS: 10.0000 mg | ORAL_TABLET | Freq: Every day | ORAL | 3 refills | Status: DC

## 2018-06-03 MED ORDER — OMEGA-3-ACID ETHYL ESTERS 1 G PO CAPS: 2.0000 g | ORAL_CAPSULE | Freq: Two times a day (BID) | ORAL | 3 refills | Status: DC

## 2018-06-03 MED ORDER — FAMOTIDINE 20 MG PO TABS: 20.0000 mg | ORAL_TABLET | Freq: Two times a day (BID) | ORAL | 11 refills | Status: DC

## 2018-06-03 NOTE — Progress Notes (Signed)
Virtual Visit via WebEx/MyChart   I connected with  Angel Marshall  on 06/03/18 via WebEx/MyChart and verified that I am speaking with the correct person using two identifiers.   I discussed the limitations, risks, security and privacy concerns of performing an evaluation and management service by WebEx/MyChart , including the higher likelihood of inaccurate diagnosis and treatment, and the availability of in person appointments.  We also discussed the likely need of an additional face to face encounter for complete and high quality delivery of care.  I also discussed with the patient that there may be a patient responsible charge related to this service. The patient expressed understanding and wishes to proceed.  Provider location is either at home or medical facility. Patient location is at their home, different from provider location. People involved in care of the patient during this telehealth encounter were myself, my nurse/medical assistant, and my front office/scheduling team member.  Subjective:    CC: Follow-up  HPI: Hyperlipidemia: Stable on Crestor, Lovaza, needs refills and labs.  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  Metabolic syndrome: With obesity, currently on metformin, needs to start Ozempic.  This will also help with weight loss.  Tick bite: Occurred several days ago, the tick was on for greater than 24 hours, no fevers, chills, headaches, muscle aches, body aches, he does have a bit of an erythematous rash that does not appear like erythema chronicum migrans, however it is minimally tender and thus may represent a cellulitis.  Toenail fungus: We have tried Lamisil now for 12 months, 0 efficacy.  He is agreeable to try a topical agent, understands that we might need to do a toenail biopsy if it is not approved right away.  I reviewed the past medical history, family history, social history, surgical history, and allergies today and no changes were  needed.  Please see the problem list section below in epic for further details.  Past Medical History: Past Medical History:  Diagnosis Date  . GERD (gastroesophageal reflux disease)    Past Surgical History: Past Surgical History:  Procedure Laterality Date  . APPENDECTOMY  09/2015  . LAPAROSCOPIC APPENDECTOMY N/A 09/25/2015   Procedure: APPENDECTOMY LAPAROSCOPIC;  Surgeon: Erroll Luna, MD;  Location: [facility];  Service: General;  Laterality: N/A;  . UMBILECTOMY    . UMBILICAL SURGERY     Social History: Social History   Socioeconomic History  . Marital status: Married    Spouse name: Levy Sjogren  . Number of children: 3  . Years of education: Not on file  . Highest education level: Doctorate  Occupational History  . Occupation: Marine scientist: [employer]  . Financial resource strain: Not on file  . Food insecurity:    Worry: Not on file    Inability: Not on file  . Transportation needs:    Medical: Not on file    Non-medical: Not on file  Tobacco Use  . Smoking status: Never Smoker  . Smokeless tobacco: Never Used  Substance and Sexual Activity  . Alcohol use: No    Alcohol/week: 0.0 standard drinks  . Drug use: No  . Sexual activity: Not on file  Lifestyle  . Physical activity:    Days per week: Not on file    Minutes per session: Not on file  . Stress: Not on file  Relationships  . Social connections:    Talks on phone: Not on file    Gets together: Not on  file    Attends religious service: Not on file    Active member of club or organization: Not on file    Attends meetings of clubs or organizations: Not on file    Relationship status: Not on file  Other Topics Concern  . Not on file  Social History Narrative  . Not on file   Family History: Family History  Problem Relation Age of Onset  . Hyperlipidemia Mother   . Cancer Mother        Breast  . Hyperlipidemia Father   . Diabetes Neg Hx   . Heart attack Neg Hx   .  Hypertension Neg Hx   . Sudden death Neg Hx    Allergies: No Known Allergies Medications: See med rec.  Review of Systems: No fevers, chills, night sweats, weight loss, chest pain, or shortness of breath.   Objective:    General: Speaking full sentences, no audible heavy breathing.  Sounds alert and appropriately interactive.  Appears well.  Face symmetric.  Extraocular movements intact.  Pupils equal and round.  No nasal flaring or accessory muscle use visualized.  No other physical exam performed due to the non-physical nature of this visit.  Impression and Recommendations:    Metabolic syndrome They continue been occasionally. I am going to add metformin and Ozempic. Ozempic has better weight loss data and is more tolerable and infrequent dosing than Saxenda.  Mixed hyperlipidemia Under adequate control, rechecking lipids, refilling Lovaza and Crestor.  Onychomycosis Unfortunately insufficient efficacy of Lamisil oral for 48-month I am to send in [place], if this is not we will need to bring him in for a nail biopsy.  Screening for malignant neoplasm of prostate Refilling Clomid, rechecking testosterone, PSA.  Tick bite Unsure of what species of tick, attached for 48 hours, no constitutional symptoms but he does have a ring of erythema. Adding doxycycline.   I discussed the above assessment and treatment plan with the patient. The patient was provided an opportunity to ask questions and all were answered. The patient agreed with the plan and demonstrated an understanding of the instructions.   The patient was advised to call back or seek an in-person evaluation if the symptoms worsen or if the condition fails to improve as anticipated.   I provided 25 minutes of non-face-to-face time during this encounter, 15 minutes of additional time was needed to gather information, review chart, records, communicate/coordinate with staff remotely, and complete documentation.    ___________________________________________ Gwen Her. Dianah Field, M.D., ABFM., CAQSM. Primary Care and Sports Medicine [facility]  Adjunct Professor of [place] of [facility] of Medicine

## 2018-06-03 NOTE — Telephone Encounter (Signed)
Received fax from [employer] that Jublia requires a PA. Information has been sent to the insurance company. Awaiting determination.

## 2018-06-03 NOTE — Assessment & Plan Note (Signed)
Unsure of what species of tick, attached for 48 hours, no constitutional symptoms but he does have a ring of erythema. Adding doxycycline.

## 2018-06-03 NOTE — Assessment & Plan Note (Signed)
Under adequate control, rechecking lipids, refilling Lovaza and Crestor.

## 2018-06-03 NOTE — Assessment & Plan Note (Signed)
They continue been occasionally. I am going to add metformin and Ozempic. Ozempic has better weight loss data and is more tolerable and infrequent dosing than Saxenda.

## 2018-06-03 NOTE — Assessment & Plan Note (Signed)
Refilling Clomid, rechecking testosterone, PSA.

## 2018-06-03 NOTE — Assessment & Plan Note (Signed)
Unfortunately insufficient efficacy of Lamisil oral for 24-month I am to send in [place], if this is not we will need to bring him in for a nail biopsy.

## 2018-06-03 NOTE — Telephone Encounter (Signed)
Patient scheduled.

## 2018-06-04 MED ORDER — TAVABOROLE 5 % EX SOLN: 1.0000 "application " | Freq: Every day | CUTANEOUS | 11 refills | Status: DC

## 2018-06-04 NOTE — Telephone Encounter (Signed)
Switching to [place].

## 2018-06-04 NOTE — Telephone Encounter (Signed)
Received a fax from [place] that Jublia was not on the patient's formulary and was excluded from what the patient could use. The preferred products are Ciclopirox 8% or Kerydin. I have let the pharmacy know its not covered and they will wait for something else. Please advise.

## 2018-06-07 MED ORDER — SEMAGLUTIDE(0.25 OR 0.5MG/DOS) 2 MG/1.5ML ~~LOC~~ SOPN: 0.5000 mg | PEN_INJECTOR | SUBCUTANEOUS | 1 refills | Status: DC

## 2018-06-07 MED ORDER — SEMAGLUTIDE (1 MG/DOSE) 2 MG/1.5ML ~~LOC~~ SOPN: 1.0000 mg | PEN_INJECTOR | SUBCUTANEOUS | 11 refills | Status: DC

## 2018-06-07 NOTE — Telephone Encounter (Signed)
Angel Marshall reports that Ozempic did not come through and he would like it re-sent. Please advise.

## 2018-06-10 ENCOUNTER — Telehealth: Payer: Self-pay | Admitting: Sports Medicine

## 2018-06-10 NOTE — Telephone Encounter (Signed)
Medication:  Jublia 10% Solution  Key: [id number]  [employer]MED[employer]

## 2018-06-11 NOTE — Telephone Encounter (Signed)
See other note. Jublia was denied.

## 2018-06-20 LAB — COMPLETE METABOLIC PANEL WITH GFR
AG Ratio: 1.8 (calc) (ref 1.0–2.5)
ALT: 30 U/L (ref 9–46)
AST: 32 U/L (ref 10–40)
Albumin: 4.6 g/dL (ref 3.6–5.1)
Alkaline phosphatase (APISO): 51 U/L (ref 36–130)
BUN: 15 mg/dL (ref 7–25)
CO2: 30 mmol/L (ref 20–32)
Calcium: 10 mg/dL (ref 8.6–10.3)
Chloride: 105 mmol/L (ref 98–110)
Creat: 1.14 mg/dL (ref 0.60–1.35)
GFR, Est African American: 94 mL/min/{1.73_m2} (ref >=60)
GFR, Est Non African American: 81 mL/min/{1.73_m2} (ref >=60)
Globulin: 2.6 g/dL (calc) (ref 1.9–3.7)
Glucose, Bld: 90 mg/dL (ref 65–99)
Potassium: 5.2 mmol/L (ref 3.5–5.3)
Sodium: 142 mmol/L (ref 135–146)
Total Bilirubin: 0.7 mg/dL (ref 0.2–1.2)
Total Protein: 7.2 g/dL (ref 6.1–8.1)

## 2018-06-20 LAB — VITAMIN D 25 HYDROXY (VIT D DEFICIENCY, FRACTURES): Vit D, 25-Hydroxy: 39 ng/mL (ref 30–100)

## 2018-06-20 LAB — CBC
HCT: 51.3 % — ABNORMAL HIGH (ref 38.5–50.0)
Hemoglobin: 17.1 g/dL (ref 13.2–17.1)
MCH: 28.7 pg (ref 27.0–33.0)
MCHC: 33.3 g/dL (ref 32.0–36.0)
MCV: 86.2 fL (ref 80.0–100.0)
MPV: 11.1 fL (ref 7.5–12.5)
Platelets: 213 10*3/uL (ref 140–400)
RBC: 5.95 10*6/uL — ABNORMAL HIGH (ref 4.20–5.80)
RDW: 12 % (ref 11.0–15.0)
WBC: 5.7 10*3/uL (ref 3.8–10.8)

## 2018-06-20 LAB — HEMOGLOBIN A1C
Hgb A1c MFr Bld: 5.2 % of total Hgb (ref <5.7)
Mean Plasma Glucose: 103 (calc)
eAG (mmol/L): 5.7 (calc)

## 2018-06-20 LAB — TESTOSTERONE, FREE & TOTAL
Free Testosterone: 160.5 pg/mL — ABNORMAL HIGH (ref 35.0–155.0)
Testosterone, Total, LC-MS-MS: 823 ng/dL (ref 250–1100)

## 2018-06-20 LAB — PSA, TOTAL AND FREE
PSA, % Free: 50 % (calc) (ref >25)
PSA, Free: 0.3 ng/mL
PSA, Total: 0.6 ng/mL (ref <=4.0)

## 2018-06-20 LAB — TSH: TSH: 2.33 mIU/L (ref 0.40–4.50)

## 2018-06-20 LAB — LIPID PANEL W/REFLEX DIRECT LDL
Cholesterol: 115 mg/dL (ref <200)
HDL: 33 mg/dL — ABNORMAL LOW (ref >=40)
LDL Cholesterol (Calc): 69 mg/dL (calc)
Non-HDL Cholesterol (Calc): 82 mg/dL (calc) (ref <130)
Total CHOL/HDL Ratio: 3.5 (calc) (ref <5.0)
Triglycerides: 44 mg/dL (ref <150)

## 2018-08-09 ENCOUNTER — Encounter: Payer: Self-pay | Admitting: Sports Medicine

## 2018-08-09 DIAGNOSIS — Z3009 Encounter for other general counseling and advice on contraception: Secondary | ICD-10-CM

## 2018-08-20 ENCOUNTER — Telehealth: Payer: Self-pay | Admitting: Sports Medicine

## 2018-08-20 MED ORDER — OMEPRAZOLE 10 MG PO CPDR: 40.0000 mg | DELAYED_RELEASE_CAPSULE | Freq: Every day | ORAL | 1 refills | Status: DC

## 2018-08-20 MED ORDER — FAMOTIDINE 20 MG PO TABS: 40.0000 mg | ORAL_TABLET | Freq: Two times a day (BID) | ORAL | 3 refills | Status: DC

## 2018-08-20 NOTE — Telephone Encounter (Signed)
Done

## 2018-08-20 NOTE — Telephone Encounter (Signed)
Patient called and was taking Pepcid in the past for his GERD and its not working since he is now taking the [place]. Patient wants to know if you can send 360 capsule with 1 refill so that he can take up to 4 (patient reports if he has too much alcohol as this make it worse). Please advise if ok to send in to the pharmacy.

## 2019-01-26 ENCOUNTER — Other Ambulatory Visit: Payer: Self-pay | Admitting: Sports Medicine

## 2019-01-26 DIAGNOSIS — R635 Abnormal weight gain: Secondary | ICD-10-CM

## 2019-04-29 ENCOUNTER — Ambulatory Visit: Payer: No Typology Code available for payment source | Admitting: Sports Medicine

## 2019-06-07 ENCOUNTER — Other Ambulatory Visit: Payer: Self-pay | Admitting: Sports Medicine

## 2019-06-23 ENCOUNTER — Other Ambulatory Visit: Payer: Self-pay | Admitting: Sports Medicine

## 2019-06-24 ENCOUNTER — Other Ambulatory Visit: Payer: Self-pay | Admitting: Sports Medicine

## 2019-07-19 ENCOUNTER — Other Ambulatory Visit: Payer: Self-pay | Admitting: Sports Medicine

## 2019-07-19 DIAGNOSIS — E782 Mixed hyperlipidemia: Secondary | ICD-10-CM

## 2019-07-29 ENCOUNTER — Other Ambulatory Visit (HOSPITAL_BASED_OUTPATIENT_CLINIC_OR_DEPARTMENT_OTHER): Payer: Self-pay | Admitting: Sports Medicine

## 2019-07-29 ENCOUNTER — Other Ambulatory Visit: Payer: Self-pay | Admitting: Sports Medicine

## 2019-07-29 DIAGNOSIS — E8881 Metabolic syndrome: Secondary | ICD-10-CM

## 2019-08-15 ENCOUNTER — Other Ambulatory Visit: Payer: Self-pay | Admitting: Sports Medicine

## 2019-08-29 ENCOUNTER — Other Ambulatory Visit: Payer: Self-pay | Admitting: Sports Medicine

## 2019-12-14 ENCOUNTER — Ambulatory Visit (INDEPENDENT_AMBULATORY_CARE_PROVIDER_SITE_OTHER): Payer: No Typology Code available for payment source | Admitting: Sports Medicine

## 2019-12-14 ENCOUNTER — Other Ambulatory Visit: Payer: Self-pay

## 2019-12-14 ENCOUNTER — Encounter: Payer: Self-pay | Admitting: Sports Medicine

## 2019-12-14 ENCOUNTER — Other Ambulatory Visit: Payer: Self-pay | Admitting: Sports Medicine

## 2019-12-14 VITALS — BP 109/70 | HR 75 | Ht 71.0 in | Wt 227.0 lb

## 2019-12-14 DIAGNOSIS — E291 Testicular hypofunction: Secondary | ICD-10-CM | POA: Diagnosis not present

## 2019-12-14 DIAGNOSIS — D229 Melanocytic nevi, unspecified: Secondary | ICD-10-CM | POA: Diagnosis not present

## 2019-12-14 DIAGNOSIS — Z0000 Encounter for general adult medical examination without abnormal findings: Secondary | ICD-10-CM | POA: Diagnosis not present

## 2019-12-14 DIAGNOSIS — E782 Mixed hyperlipidemia: Secondary | ICD-10-CM

## 2019-12-14 DIAGNOSIS — E8881 Metabolic syndrome: Secondary | ICD-10-CM

## 2019-12-14 DIAGNOSIS — B351 Tinea unguium: Secondary | ICD-10-CM | POA: Diagnosis not present

## 2019-12-14 MED ORDER — FAMOTIDINE 20 MG PO TABS
40.0000 mg | ORAL_TABLET | Freq: Two times a day (BID) | ORAL | 3 refills | Status: DC
Start: 2019-12-14 — End: 2019-12-14

## 2019-12-14 MED ORDER — ROSUVASTATIN CALCIUM 10 MG PO TABS
10.0000 mg | ORAL_TABLET | Freq: Every day | ORAL | 3 refills | Status: DC
  Filled 2020-06-19: qty 90, 90d supply, fill #0
  Filled 2020-11-12: qty 90, 90d supply, fill #1

## 2019-12-14 MED ORDER — OMEPRAZOLE 10 MG PO CPDR
40.0000 mg | DELAYED_RELEASE_CAPSULE | Freq: Every day | ORAL | 1 refills | Status: DC
Start: 2019-12-14 — End: 2019-12-14

## 2019-12-14 MED ORDER — CLOMIPHENE CITRATE 50 MG PO TABS
ORAL_TABLET | ORAL | 1 refills | Status: DC
Start: 2019-12-14 — End: 2021-02-12

## 2019-12-14 MED ORDER — OMEGA-3-ACID ETHYL ESTERS 1 G PO CAPS: 2.0000 g | ORAL_CAPSULE | Freq: Two times a day (BID) | ORAL | 3 refills | Status: DC

## 2019-12-14 MED ORDER — TERBINAFINE HCL 250 MG PO TABS: 250.0000 mg | ORAL_TABLET | Freq: Every day | ORAL | 3 refills | Status: DC

## 2019-12-14 MED ORDER — OZEMPIC (1 MG/DOSE) 2 MG/1.5ML ~~LOC~~ SOPN: 1.0000 mg | PEN_INJECTOR | SUBCUTANEOUS | 11 refills | Status: DC

## 2019-12-14 NOTE — Assessment & Plan Note (Signed)
We tried Lamisil orally for 12 months, Jublia was not effective. We will start Lamisil again, I will give him a 6 to 85-month course, he will take pictures of his toes today, and we can look at his nails again in 6 to 9 months. We do need to check LFTs again at the 4 to 8-week point.

## 2019-12-14 NOTE — Assessment & Plan Note (Signed)
Annual physical exam as above, up-to-date on screening measures, rechecking routine labs.

## 2019-12-14 NOTE — Progress Notes (Signed)
Subjective:    CC: Annual Physical Exam  HPI:  This patient is here for their annual physical  I reviewed the past medical history, family history, social history, surgical history, and allergies today and no changes were needed.  Please see the problem list section below in epic for further details.  Past Medical History: Past Medical History:  Diagnosis Date  . GERD (gastroesophageal reflux disease)    Past Surgical History: Past Surgical History:  Procedure Laterality Date  . APPENDECTOMY  09/2015  . LAPAROSCOPIC APPENDECTOMY N/A 09/25/2015   Procedure: APPENDECTOMY LAPAROSCOPIC;  Surgeon: Erroll Luna, MD;  Location: [facility];  Service: General;  Laterality: N/A;  . UMBILECTOMY    . UMBILICAL SURGERY     Social History: Social History   Socioeconomic History  . Marital status: Married    Spouse name: Levy Sjogren  . Number of children: 3  . Years of education: Not on file  . Highest education level: Doctorate  Occupational History  . Occupation: Marine scientist: [employer]  Tobacco Use  . Smoking status: Never Smoker  . Smokeless tobacco: Never Used  Substance and Sexual Activity  . Alcohol use: No    Alcohol/week: 0.0 standard drinks  . Drug use: No  . Sexual activity: Not on file  Other Topics Concern  . Not on file  Social History Narrative  . Not on file   Social Determinants of Health   Financial Resource Strain:   . Difficulty of Paying Living Expenses: Not on file  Food Insecurity:   . Worried About Charity fundraiser in the Last Year: Not on file  . Ran Out of Food in the Last Year: Not on file  Transportation Needs:   . Lack of Transportation (Medical): Not on file  . Lack of Transportation (Non-Medical): Not on file  Physical Activity:   . Days of Exercise per Week: Not on file  . Minutes of Exercise per Session: Not on file  Stress:   . Feeling of Stress : Not on file  Social Connections:   . Frequency of Communication with Friends  and Family: Not on file  . Frequency of Social Gatherings with Friends and Family: Not on file  . Attends Religious Services: Not on file  . Active Member of Clubs or Organizations: Not on file  . Attends Archivist Meetings: Not on file  . Marital Status: Not on file   Family History: Family History  Problem Relation Age of Onset  . Hyperlipidemia Mother   . Cancer Mother        Breast  . Hyperlipidemia Father   . Diabetes Neg Hx   . Heart attack Neg Hx   . Hypertension Neg Hx   . Sudden death Neg Hx    Allergies: No Known Allergies Medications: See med rec.  Review of Systems: No headache, visual changes, nausea, vomiting, diarrhea, constipation, dizziness, abdominal pain, skin rash, fevers, chills, night sweats, swollen lymph nodes, weight loss, chest pain, body aches, joint swelling, muscle aches, shortness of breath, mood changes, visual or auditory hallucinations.  Objective:    General: Well Developed, well nourished, and in no acute distress.  Neuro: Alert and oriented x3, extra-ocular muscles intact, sensation grossly intact. Cranial nerves II through XII are intact, motor, sensory, and coordinative functions are all intact. HEENT: Normocephalic, atraumatic, pupils equal round reactive to light, neck supple, no masses, no lymphadenopathy, thyroid nonpalpable. Oropharynx, nasopharynx, external ear canals are unremarkable. Skin: Warm  and dry, no rashes noted.  1 cm concerning nevus left low back. Cardiac: Regular rate and rhythm, no murmurs rubs or gallops.  Respiratory: Clear to auscultation bilaterally. Not using accessory muscles, speaking in full sentences.  Abdominal: Soft, nontender, nondistended, positive bowel sounds, no masses, no organomegaly.  Musculoskeletal: Shoulder, elbow, wrist, hip, knee, ankle stable, and with full range of motion.  Impression and Recommendations:    The patient was counselled, risk factors were discussed, anticipatory guidance  given.  Annual physical exam Annual physical exam as above, up-to-date on screening measures, rechecking routine labs.  Onychomycosis We tried Lamisil orally for 12 months, Jublia was not effective. We will start Lamisil again, I will give him a 6 to 70-month course, he will take pictures of his toes today, and we can look at his nails again in 6 to 9 months. We do need to check LFTs again at the 4 to 8-week point.  Suspicious nevus Been has a suspicious nevus on the left side of his low back, there is a grouping of 3, the lateralmost one is concerning, he will return in a 15-minute slot for punch biopsy.  Hypogonadism in male We have helped been with his testosterone, added Clomid, levels have been good, 800s the last time we checked, he has noted really good improvements in his sex drive, ability to put on lean muscle as well as energy. Rechecking testosterone, PSA.   ___________________________________________ Gwen Her. Dianah Field, M.D., ABFM., CAQSM. Primary Care and Sports Medicine [facility]  Adjunct Professor of [place] of [facility] of Medicine

## 2019-12-14 NOTE — Assessment & Plan Note (Signed)
We have helped been with his testosterone, added Clomid, levels have been good, 800s the last time we checked, he has noted really good improvements in his sex drive, ability to put on lean muscle as well as energy. Rechecking testosterone, PSA.

## 2019-12-14 NOTE — Assessment & Plan Note (Signed)
Angel Marshall has a suspicious nevus on the left side of his low back, there is a grouping of 3, the lateralmost one is concerning, he will return in a 15-minute slot for punch biopsy.

## 2019-12-20 ENCOUNTER — Other Ambulatory Visit (HOSPITAL_BASED_OUTPATIENT_CLINIC_OR_DEPARTMENT_OTHER): Payer: Self-pay | Admitting: Internal Medicine

## 2019-12-20 ENCOUNTER — Ambulatory Visit: Payer: No Typology Code available for payment source | Attending: Internal Medicine

## 2019-12-20 DIAGNOSIS — Z23 Encounter for immunization: Secondary | ICD-10-CM

## 2019-12-23 LAB — PSA, TOTAL AND FREE
PSA, % Free: 60 % (calc) (ref >25)
PSA, Free: 0.3 ng/mL
PSA, Total: 0.5 ng/mL (ref <=4.0)

## 2019-12-23 LAB — COMPREHENSIVE METABOLIC PANEL
AG Ratio: 1.8 (calc) (ref 1.0–2.5)
ALT: 27 U/L (ref 9–46)
AST: 31 U/L (ref 10–40)
Albumin: 4.3 g/dL (ref 3.6–5.1)
Alkaline phosphatase (APISO): 55 U/L (ref 36–130)
BUN: 16 mg/dL (ref 7–25)
CO2: 30 mmol/L (ref 20–32)
Calcium: 9.6 mg/dL (ref 8.6–10.3)
Chloride: 107 mmol/L (ref 98–110)
Creat: 1.24 mg/dL (ref 0.60–1.35)
Globulin: 2.4 g/dL (calc) (ref 1.9–3.7)
Glucose, Bld: 93 mg/dL (ref 65–99)
Potassium: 4.9 mmol/L (ref 3.5–5.3)
Sodium: 141 mmol/L (ref 135–146)
Total Bilirubin: 0.7 mg/dL (ref 0.2–1.2)
Total Protein: 6.7 g/dL (ref 6.1–8.1)

## 2019-12-23 LAB — CBC
HCT: 48.2 % (ref 38.5–50.0)
Hemoglobin: 16.5 g/dL (ref 13.2–17.1)
MCH: 29.7 pg (ref 27.0–33.0)
MCHC: 34.2 g/dL (ref 32.0–36.0)
MCV: 86.8 fL (ref 80.0–100.0)
MPV: 11 fL (ref 7.5–12.5)
Platelets: 207 10*3/uL (ref 140–400)
RBC: 5.55 10*6/uL (ref 4.20–5.80)
RDW: 12.2 % (ref 11.0–15.0)
WBC: 5.9 10*3/uL (ref 3.8–10.8)

## 2019-12-23 LAB — TESTOSTERONE, FREE & TOTAL
Free Testosterone: 138.1 pg/mL (ref 35.0–155.0)
Testosterone, Total, LC-MS-MS: 612 ng/dL (ref 250–1100)

## 2019-12-23 LAB — TSH: TSH: 1.83 mIU/L (ref 0.40–4.50)

## 2019-12-23 LAB — HEPATITIS C ANTIBODY
Hepatitis C Ab: NONREACTIVE
SIGNAL TO CUT-OFF: 0.02 (ref <1.00)

## 2019-12-23 LAB — LIPID PANEL
Cholesterol: 126 mg/dL (ref <200)
HDL: 31 mg/dL — ABNORMAL LOW (ref >=40)
LDL Cholesterol (Calc): 81 mg/dL (calc)
Non-HDL Cholesterol (Calc): 95 mg/dL (calc) (ref <130)
Total CHOL/HDL Ratio: 4.1 (calc) (ref <5.0)
Triglycerides: 67 mg/dL (ref <150)

## 2019-12-23 LAB — HEMOGLOBIN A1C
Hgb A1c MFr Bld: 5.1 % of total Hgb (ref <5.7)
Mean Plasma Glucose: 100 (calc)
eAG (mmol/L): 5.5 (calc)

## 2019-12-23 LAB — HIV ANTIBODY (ROUTINE TESTING W REFLEX): HIV 1&2 Ab, 4th Generation: NONREACTIVE

## 2020-05-01 ENCOUNTER — Other Ambulatory Visit: Payer: Self-pay | Admitting: Sports Medicine

## 2020-05-01 ENCOUNTER — Other Ambulatory Visit: Payer: Self-pay

## 2020-05-01 ENCOUNTER — Ambulatory Visit (INDEPENDENT_AMBULATORY_CARE_PROVIDER_SITE_OTHER): Payer: No Typology Code available for payment source | Admitting: Sports Medicine

## 2020-05-01 DIAGNOSIS — B351 Tinea unguium: Secondary | ICD-10-CM

## 2020-05-01 DIAGNOSIS — D229 Melanocytic nevi, unspecified: Secondary | ICD-10-CM | POA: Diagnosis not present

## 2020-05-01 DIAGNOSIS — D239 Other benign neoplasm of skin, unspecified: Secondary | ICD-10-CM

## 2020-05-01 LAB — COMPREHENSIVE METABOLIC PANEL
AG Ratio: 1.7 (calc) (ref 1.0–2.5)
ALT: 31 U/L (ref 9–46)
AST: 26 U/L (ref 10–40)
Albumin: 4.5 g/dL (ref 3.6–5.1)
Alkaline phosphatase (APISO): 61 U/L (ref 36–130)
BUN: 14 mg/dL (ref 7–25)
CO2: 31 mmol/L (ref 20–32)
Calcium: 9.6 mg/dL (ref 8.6–10.3)
Chloride: 105 mmol/L (ref 98–110)
Creat: 1.05 mg/dL (ref 0.60–1.35)
Globulin: 2.7 g/dL (calc) (ref 1.9–3.7)
Glucose, Bld: 89 mg/dL (ref 65–99)
Potassium: 4.5 mmol/L (ref 3.5–5.3)
Sodium: 141 mmol/L (ref 135–146)
Total Bilirubin: 0.7 mg/dL (ref 0.2–1.2)
Total Protein: 7.2 g/dL (ref 6.1–8.1)

## 2020-05-01 NOTE — Addendum Note (Signed)
Addended by: Dema Severin on: 05/01/2020 09:33 AM   Modules accepted: Orders

## 2020-05-01 NOTE — Assessment & Plan Note (Signed)
After 4 months of Lamisil we are starting to see some improvement on his second and third toes of the right foot. I think we need to consider continue for a full 6 to 9 months. Rechecking LFTs. Return to see me and in approximately 5 months for this.

## 2020-05-01 NOTE — Progress Notes (Addendum)
    Procedures performed today:    Procedure: Punch biopsy of a suspicious nevus Risks, benefits, and alternatives explained and consent obtained. Time out conducted. Surface prepped with alcohol. 5cc lidocaine with epinephine infiltrated in a field block. Adequate anesthesia ensured. Area prepped and draped in a sterile fashion. Excision performed with: Using a 6 mm punch we took a full-thickness sample, closed the incision with a single 3-0 Ethilon simple interrupted stitch. Hemostasis achieved. Pt stable.  Independent interpretation of notes and tests performed by another provider:   None.  Brief History, Exam, Impression, and Recommendations:    Dysplastic nevus Punch biopsy of a suspicious nevus, part of a group of 3. Single suture placed, return in 10 days for suture removal.  Dermatopathology shows a dysplastic compound nevus with severe atypia, this is one-step below melanoma, the recommendation is for reexcision and I agree, we will probably need to take a good 1 to 2 cm of healthy tissue.   Onychomycosis After 4 months of Lamisil we are starting to see some improvement on his second and third toes of the right foot. I think we need to consider continue for a full 6 to 9 months. Rechecking LFTs. Return to see me and in approximately 5 months for this.    ___________________________________________ Gwen Her. Dianah Field, M.D., ABFM., CAQSM. Primary Care and [place] Instructor of [place] of [facility] of Medicine

## 2020-05-01 NOTE — Assessment & Plan Note (Addendum)
Punch biopsy of a suspicious nevus, part of a group of 3. Single suture placed, return in 10 days for suture removal.  Dermatopathology shows a dysplastic compound nevus with severe atypia, this is one-step below melanoma, the recommendation is for reexcision and I agree, we will probably need to take a good 1 to 2 cm of healthy tissue.

## 2020-05-12 ENCOUNTER — Other Ambulatory Visit (HOSPITAL_BASED_OUTPATIENT_CLINIC_OR_DEPARTMENT_OTHER): Payer: Self-pay

## 2020-05-12 MED FILL — Famotidine Tab 20 MG: ORAL | 90 days supply | Qty: 360 | Fill #0 | Status: CN

## 2020-05-13 ENCOUNTER — Other Ambulatory Visit (HOSPITAL_COMMUNITY): Payer: Self-pay

## 2020-05-14 ENCOUNTER — Other Ambulatory Visit (HOSPITAL_BASED_OUTPATIENT_CLINIC_OR_DEPARTMENT_OTHER): Payer: Self-pay

## 2020-05-14 ENCOUNTER — Other Ambulatory Visit (HOSPITAL_COMMUNITY): Payer: Self-pay

## 2020-05-14 MED FILL — Fluticasone Propionate Nasal Susp 50 MCG/ACT: NASAL | 90 days supply | Qty: 48 | Fill #0 | Status: CN

## 2020-05-15 ENCOUNTER — Other Ambulatory Visit (HOSPITAL_BASED_OUTPATIENT_CLINIC_OR_DEPARTMENT_OTHER): Payer: Self-pay

## 2020-05-15 ENCOUNTER — Ambulatory Visit: Payer: No Typology Code available for payment source | Admitting: Sports Medicine

## 2020-05-16 ENCOUNTER — Other Ambulatory Visit (HOSPITAL_BASED_OUTPATIENT_CLINIC_OR_DEPARTMENT_OTHER): Payer: Self-pay

## 2020-05-16 ENCOUNTER — Other Ambulatory Visit: Payer: Self-pay | Admitting: Sports Medicine

## 2020-05-16 ENCOUNTER — Ambulatory Visit (INDEPENDENT_AMBULATORY_CARE_PROVIDER_SITE_OTHER): Payer: No Typology Code available for payment source | Admitting: Sports Medicine

## 2020-05-16 ENCOUNTER — Other Ambulatory Visit: Payer: Self-pay

## 2020-05-16 DIAGNOSIS — D239 Other benign neoplasm of skin, unspecified: Secondary | ICD-10-CM | POA: Diagnosis not present

## 2020-05-16 MED ORDER — METHOCARBAMOL 500 MG PO TABS
500.0000 mg | ORAL_TABLET | Freq: Three times a day (TID) | ORAL | 11 refills | Status: DC
  Filled 2020-05-16: qty 90, 30d supply, fill #0

## 2020-05-16 MED ORDER — HYDROCODONE-ACETAMINOPHEN 10-325 MG PO TABS
1.0000 | ORAL_TABLET | Freq: Three times a day (TID) | ORAL | 0 refills | Status: DC | PRN
  Filled 2020-05-16: qty 15, 5d supply, fill #0

## 2020-05-16 MED ORDER — PREDNISONE 10 MG PO TABS
ORAL_TABLET | Freq: Every day | ORAL | 0 refills | Status: DC
  Filled 2020-05-16: qty 48, 12d supply, fill #0

## 2020-05-16 NOTE — Assessment & Plan Note (Addendum)
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   Last week we did a punch biopsy of a suspicious nevus that was part of a group of 3, the Derm path showed a dysplastic compound nevus with severe atypia, for this reason we brought him back for wider excision with a 1 cm margin. I would like him to also touch base with dermatology at this juncture. Return to see me in 10 days for suture removal. Hydrocodone for postoperative pain.

## 2020-05-16 NOTE — Progress Notes (Signed)
    Procedures performed today:    Procedure:  Excision of low back dysplastic nevus, 1 cm Risks, benefits, and alternatives explained and consent obtained. Time out conducted. Surface prepped with alcohol. 5cc lidocaine with epinephine infiltrated in a field block. Adequate anesthesia ensured. Area prepped and draped in a sterile fashion. Excision performed with: Using a #10 blade I made an elliptical incision, carried the dissection down into the subcutaneous tissues and removed the lesion with existing suture, I then closed the incision with 2 3-0 horizontal mattress Ethilon sutures. Hemostasis achieved. Pt stable.  Independent interpretation of notes and tests performed by another provider:   None.  Brief History, Exam, Impression, and Recommendations:    Dysplastic nevus Last week we did a punch biopsy of a suspicious nevus that was part of a group of 3, the Derm path showed a dysplastic compound nevus with severe atypia, for this reason we brought him back for wider excision with a 1 cm margin. I would like him to also touch base with dermatology at this juncture. Return to see me in 10 days for suture removal.    ___________________________________________ Gwen Her. Dianah Field, M.D., ABFM., CAQSM. Primary Care and [place] Instructor of [employer] of [facility] of Medicine

## 2020-05-16 NOTE — Addendum Note (Signed)
Addended by: Dema Severin on: 05/16/2020 10:03 AM   Modules accepted: Orders

## 2020-05-28 ENCOUNTER — Other Ambulatory Visit: Payer: Self-pay

## 2020-05-28 ENCOUNTER — Encounter: Payer: Self-pay | Admitting: Sports Medicine

## 2020-05-28 ENCOUNTER — Ambulatory Visit (INDEPENDENT_AMBULATORY_CARE_PROVIDER_SITE_OTHER): Payer: No Typology Code available for payment source | Admitting: Sports Medicine

## 2020-05-28 DIAGNOSIS — D239 Other benign neoplasm of skin, unspecified: Secondary | ICD-10-CM

## 2020-05-28 NOTE — Progress Notes (Signed)
    Procedures performed today:    None.  Independent interpretation of notes and tests performed by another provider:   None.  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 Incision is clean, dry, intact, sutures removed, return as needed.    ___________________________________________ Gwen Her. Dianah Field, M.D., ABFM., CAQSM. Primary Care and [place] Instructor of [facility] of Medicine

## 2020-05-28 NOTE — Assessment & Plan Note (Addendum)
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 Incision is clean, dry, intact, sutures removed, return as needed.

## 2020-06-01 ENCOUNTER — Other Ambulatory Visit (HOSPITAL_BASED_OUTPATIENT_CLINIC_OR_DEPARTMENT_OTHER): Payer: Self-pay

## 2020-06-01 DIAGNOSIS — J111 Influenza due to unidentified influenza virus with other respiratory manifestations: Secondary | ICD-10-CM | POA: Insufficient documentation

## 2020-06-01 MED ORDER — OSELTAMIVIR PHOSPHATE 75 MG PO CAPS
75.0000 mg | ORAL_CAPSULE | Freq: Two times a day (BID) | ORAL | 0 refills | Status: DC
Start: 2020-06-01 — End: 2020-07-04
  Filled 2020-06-01: qty 10, 5d supply, fill #0

## 2020-06-01 NOTE — Assessment & Plan Note (Signed)
Angel Marshall's family tested positive for the flu, he has developed symptoms, sitting in Tamiflu.

## 2020-06-11 ENCOUNTER — Other Ambulatory Visit: Payer: Self-pay | Admitting: Sports Medicine

## 2020-06-12 ENCOUNTER — Other Ambulatory Visit (HOSPITAL_BASED_OUTPATIENT_CLINIC_OR_DEPARTMENT_OTHER): Payer: Self-pay

## 2020-06-12 MED ORDER — OMEPRAZOLE 10 MG PO CPDR
DELAYED_RELEASE_CAPSULE | ORAL | 1 refills | Status: DC
  Filled 2020-06-12: qty 120, 30d supply, fill #0
  Filled 2020-09-12: qty 120, 30d supply, fill #1

## 2020-06-12 MED FILL — Famotidine Tab 20 MG: ORAL | 90 days supply | Qty: 360 | Fill #0 | Status: CN

## 2020-06-14 ENCOUNTER — Other Ambulatory Visit (HOSPITAL_BASED_OUTPATIENT_CLINIC_OR_DEPARTMENT_OTHER): Payer: Self-pay

## 2020-06-14 MED ORDER — CEPHALEXIN 250 MG/5ML PO SUSR
ORAL | 1 refills | Status: DC
  Filled 2020-06-14: qty 200, 1d supply, fill #0

## 2020-06-15 ENCOUNTER — Other Ambulatory Visit (HOSPITAL_BASED_OUTPATIENT_CLINIC_OR_DEPARTMENT_OTHER): Payer: Self-pay

## 2020-06-19 ENCOUNTER — Other Ambulatory Visit (HOSPITAL_BASED_OUTPATIENT_CLINIC_OR_DEPARTMENT_OTHER): Payer: Self-pay

## 2020-06-21 ENCOUNTER — Other Ambulatory Visit (HOSPITAL_BASED_OUTPATIENT_CLINIC_OR_DEPARTMENT_OTHER): Payer: Self-pay

## 2020-06-21 MED FILL — Semaglutide Soln Pen-inj 1 MG/DOSE (4 MG/3ML): SUBCUTANEOUS | 28 days supply | Qty: 3 | Fill #0 | Status: CN

## 2020-06-21 MED FILL — Semaglutide Soln Pen-inj 1 MG/DOSE (4 MG/3ML): SUBCUTANEOUS | 84 days supply | Qty: 9 | Fill #0 | Status: AC

## 2020-06-26 ENCOUNTER — Other Ambulatory Visit (HOSPITAL_BASED_OUTPATIENT_CLINIC_OR_DEPARTMENT_OTHER): Payer: Self-pay

## 2020-06-26 MED ORDER — CARESTART COVID-19 HOME TEST VI KIT
PACK | 0 refills | Status: DC
  Filled 2020-06-26: qty 4, 4d supply, fill #0

## 2020-06-28 ENCOUNTER — Other Ambulatory Visit (HOSPITAL_BASED_OUTPATIENT_CLINIC_OR_DEPARTMENT_OTHER): Payer: Self-pay

## 2020-06-29 ENCOUNTER — Other Ambulatory Visit (HOSPITAL_BASED_OUTPATIENT_CLINIC_OR_DEPARTMENT_OTHER): Payer: Self-pay

## 2020-07-03 ENCOUNTER — Other Ambulatory Visit (HOSPITAL_BASED_OUTPATIENT_CLINIC_OR_DEPARTMENT_OTHER): Payer: Self-pay

## 2020-07-04 ENCOUNTER — Ambulatory Visit (INDEPENDENT_AMBULATORY_CARE_PROVIDER_SITE_OTHER): Payer: No Typology Code available for payment source

## 2020-07-04 ENCOUNTER — Encounter: Payer: Self-pay | Admitting: Sports Medicine

## 2020-07-04 ENCOUNTER — Other Ambulatory Visit: Payer: Self-pay

## 2020-07-04 ENCOUNTER — Ambulatory Visit (INDEPENDENT_AMBULATORY_CARE_PROVIDER_SITE_OTHER): Payer: No Typology Code available for payment source | Admitting: Sports Medicine

## 2020-07-04 ENCOUNTER — Other Ambulatory Visit: Payer: Self-pay | Admitting: Sports Medicine

## 2020-07-04 VITALS — BP 127/85 | HR 81 | Ht 71.0 in | Wt 230.0 lb

## 2020-07-04 DIAGNOSIS — I499 Cardiac arrhythmia, unspecified: Secondary | ICD-10-CM

## 2020-07-04 DIAGNOSIS — R55 Syncope and collapse: Secondary | ICD-10-CM | POA: Diagnosis not present

## 2020-07-04 DIAGNOSIS — I471 Supraventricular tachycardia, unspecified: Secondary | ICD-10-CM | POA: Insufficient documentation

## 2020-07-04 DIAGNOSIS — R7989 Other specified abnormal findings of blood chemistry: Secondary | ICD-10-CM

## 2020-07-04 DIAGNOSIS — R079 Chest pain, unspecified: Secondary | ICD-10-CM | POA: Diagnosis not present

## 2020-07-04 DIAGNOSIS — R002 Palpitations: Secondary | ICD-10-CM

## 2020-07-04 LAB — COMPREHENSIVE METABOLIC PANEL
AG Ratio: 1.6 (calc) (ref 1.0–2.5)
ALT: 29 U/L (ref 9–46)
AST: 25 U/L (ref 10–40)
Albumin: 4.6 g/dL (ref 3.6–5.1)
Alkaline phosphatase (APISO): 65 U/L (ref 36–130)
BUN: 17 mg/dL (ref 7–25)
CO2: 31 mmol/L (ref 20–32)
Calcium: 9.4 mg/dL (ref 8.6–10.3)
Chloride: 103 mmol/L (ref 98–110)
Creat: 1.17 mg/dL (ref 0.60–1.35)
Globulin: 2.9 g/dL (calc) (ref 1.9–3.7)
Glucose, Bld: 81 mg/dL (ref 65–99)
Potassium: 4.3 mmol/L (ref 3.5–5.3)
Sodium: 141 mmol/L (ref 135–146)
Total Bilirubin: 0.5 mg/dL (ref 0.2–1.2)
Total Protein: 7.5 g/dL (ref 6.1–8.1)

## 2020-07-04 LAB — TSH: TSH: 2.73 mIU/L (ref 0.40–4.50)

## 2020-07-04 LAB — CBC
HCT: 46.6 % (ref 38.5–50.0)
Hemoglobin: 15.2 g/dL (ref 13.2–17.1)
MCH: 28.3 pg (ref 27.0–33.0)
MCHC: 32.6 g/dL (ref 32.0–36.0)
MCV: 86.8 fL (ref 80.0–100.0)
MPV: 10.3 fL (ref 7.5–12.5)
Platelets: 239 10*3/uL (ref 140–400)
RBC: 5.37 10*6/uL (ref 4.20–5.80)
RDW: 12 % (ref 11.0–15.0)
WBC: 7.8 10*3/uL (ref 3.8–10.8)

## 2020-07-04 LAB — TROPONIN I: Troponin I: 3 ng/L (ref <=47)

## 2020-07-04 LAB — BRAIN NATRIURETIC PEPTIDE: Brain Natriuretic Peptide: 32 pg/mL (ref <100)

## 2020-07-04 LAB — CK TOTAL AND CKMB (NOT AT ARMC)
CK, MB: 2 ng/mL (ref 0–5.0)
Relative Index: 0.8 (ref 0–4.0)
Total CK: 246 U/L — ABNORMAL HIGH (ref 44–196)

## 2020-07-04 LAB — D-DIMER, QUANTITATIVE: D-Dimer, Quant: 0.73 mcg/mL FEU — ABNORMAL HIGH (ref <0.50)

## 2020-07-04 NOTE — Progress Notes (Addendum)
    Procedures performed today:    Twelve-lead ECG performed and interpreted by me today, it shows normal sinus rhythm, left axis deviation, incomplete right bundle branch block, rate is 96 bpm.  Independent interpretation of notes and tests performed by another provider:   None.  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 On Sunday, 4 days ago he was sitting and playing video games, it was a pretty typical day for him, he really was not dehydrated, he had played some volleyball earlier in the day, and was feeling fine. He turned his head and immediately felt a rapid onset palpitations as though his heart was racing, he started to have tunnel vision, and lost consciousness. He woke up in the chair, and felt relatively normal afterwards. No prodromal chest pain. He does also endorse that prior to this he did have a low-grade fever of just over 100 degrees Fahrenheit. Negative COVID test. No other symptoms, no recurrence of the palpitations or presyncope. His exam is normal, auscultation is normal, PMI is normal, lungs are clear, abdominal exam is unremarkable with no organomegaly, no palpable pulsatile masses, no lower extremity edema and negative bilateral Homans' sign. I did obtain a twelve-lead ECG which does show left axis deviation, incomplete left bundle branch block, otherwise normal. We do need to work this up aggressively, Zio patch, echocardiogram, extensive labs. I would like him to have a consultation with cardiology. Return to see me after echocardiogram.  Update: D-dimer is up, Angel Marshall is going to need a CT angio of the pulmonary arteries.  Orders placed for [facility] [place] [facility] to have it done stat.  Elevated d-dimer Elevated D-dimer with below symptoms, syncope, palpitations, shortness of breath, he will need a stat CT angio of the pulmonary  arteries.     ___________________________________________ Gwen Her. Dianah Field, M.D., ABFM., CAQSM. Primary Care and [place] Instructor of [place] of [facility] of Medicine

## 2020-07-04 NOTE — Assessment & Plan Note (Addendum)
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 He turned his head and immediately felt a rapid onset palpitations as though his heart was racing, he started to have tunnel vision, and lost consciousness. He woke up in the chair, and felt relatively normal afterwards. No prodromal chest pain. He does also endorse that prior to this he did have a low-grade fever of just over 100 degrees Fahrenheit. Negative COVID test. No other symptoms, no recurrence of the palpitations or presyncope. His exam is normal, auscultation is normal, PMI is normal, lungs are clear, abdominal exam is unremarkable with no organomegaly, no palpable pulsatile masses, no lower extremity edema and negative bilateral Homans' sign. I did obtain a twelve-lead ECG which does show left axis deviation, incomplete left bundle branch block, otherwise normal. We do need to work this up aggressively, Zio patch, echocardiogram, extensive labs. I would like him to have a consultation with cardiology. Return to see me after echocardiogram.  Update: D-dimer is up, Angel Marshall is going to need a CT angio of the pulmonary arteries.  Orders placed for [facility] [place] to have it done stat.

## 2020-07-05 ENCOUNTER — Ambulatory Visit (INDEPENDENT_AMBULATORY_CARE_PROVIDER_SITE_OTHER): Payer: No Typology Code available for payment source

## 2020-07-05 ENCOUNTER — Other Ambulatory Visit (HOSPITAL_BASED_OUTPATIENT_CLINIC_OR_DEPARTMENT_OTHER): Payer: Self-pay

## 2020-07-05 DIAGNOSIS — R079 Chest pain, unspecified: Secondary | ICD-10-CM | POA: Diagnosis not present

## 2020-07-05 DIAGNOSIS — R7989 Other specified abnormal findings of blood chemistry: Secondary | ICD-10-CM | POA: Insufficient documentation

## 2020-07-05 MED ORDER — IOHEXOL 350 MG/ML SOLN
100.0000 mL | Freq: Once | INTRAVENOUS | Status: AC | PRN
  Administered 2020-07-05: 100 mL via INTRAVENOUS

## 2020-07-05 NOTE — Addendum Note (Signed)
Addended by: Silverio Decamp on: 07/05/2020 08:38 AM   Modules accepted: Orders

## 2020-07-05 NOTE — Assessment & Plan Note (Signed)
Elevated D-dimer with below symptoms, syncope, palpitations, shortness of breath, he will need a stat CT angio of the pulmonary arteries.

## 2020-07-06 ENCOUNTER — Other Ambulatory Visit (HOSPITAL_BASED_OUTPATIENT_CLINIC_OR_DEPARTMENT_OTHER): Payer: Self-pay

## 2020-07-10 DIAGNOSIS — R002 Palpitations: Secondary | ICD-10-CM

## 2020-07-10 DIAGNOSIS — I499 Cardiac arrhythmia, unspecified: Secondary | ICD-10-CM | POA: Diagnosis not present

## 2020-07-10 DIAGNOSIS — R55 Syncope and collapse: Secondary | ICD-10-CM | POA: Diagnosis not present

## 2020-07-23 DIAGNOSIS — K219 Gastro-esophageal reflux disease without esophagitis: Secondary | ICD-10-CM | POA: Insufficient documentation

## 2020-07-31 ENCOUNTER — Other Ambulatory Visit (HOSPITAL_BASED_OUTPATIENT_CLINIC_OR_DEPARTMENT_OTHER): Payer: Self-pay

## 2020-07-31 ENCOUNTER — Telehealth: Payer: Self-pay | Admitting: Sports Medicine

## 2020-07-31 DIAGNOSIS — I471 Supraventricular tachycardia, unspecified: Secondary | ICD-10-CM

## 2020-07-31 MED ORDER — METOPROLOL SUCCINATE ER 50 MG PO TB24
50.0000 mg | ORAL_TABLET | Freq: Every day | ORAL | 3 refills | Status: DC
  Filled 2020-07-31: qty 90, 90d supply, fill #0

## 2020-07-31 MED FILL — Terbinafine HCl Tab 250 MG: ORAL | 90 days supply | Qty: 90 | Fill #0 | Status: AC

## 2020-07-31 NOTE — Telephone Encounter (Signed)
Preliminary results show episodes of supraventricular tachycardia to a rate of 210, he does have an appointment coming up with cardiology, I am going to go ahead and start Toprol-XL 50 daily.

## 2020-07-31 NOTE — Assessment & Plan Note (Signed)
Preliminary results show episodes of supraventricular tachycardia to a rate of 210, he does have an appointment coming up with cardiology, I am going to go ahead and start Toprol-XL 50 daily.

## 2020-08-06 ENCOUNTER — Ambulatory Visit (HOSPITAL_BASED_OUTPATIENT_CLINIC_OR_DEPARTMENT_OTHER): Payer: No Typology Code available for payment source

## 2020-08-07 ENCOUNTER — Other Ambulatory Visit (HOSPITAL_BASED_OUTPATIENT_CLINIC_OR_DEPARTMENT_OTHER): Payer: Self-pay

## 2020-08-09 ENCOUNTER — Other Ambulatory Visit (HOSPITAL_BASED_OUTPATIENT_CLINIC_OR_DEPARTMENT_OTHER): Payer: Self-pay

## 2020-08-10 ENCOUNTER — Ambulatory Visit (HOSPITAL_COMMUNITY)
Admission: RE | Admit: 2020-08-10 | Discharge: 2020-08-10 | Disposition: A | Discharge status: Home / Self Care | Payer: No Typology Code available for payment source | Source: Ambulatory Visit | Attending: Sports Medicine | Admitting: Sports Medicine

## 2020-08-10 ENCOUNTER — Other Ambulatory Visit: Payer: Self-pay

## 2020-08-10 DIAGNOSIS — R55 Syncope and collapse: Secondary | ICD-10-CM | POA: Diagnosis not present

## 2020-08-10 DIAGNOSIS — I499 Cardiac arrhythmia, unspecified: Secondary | ICD-10-CM | POA: Insufficient documentation

## 2020-08-10 DIAGNOSIS — E785 Hyperlipidemia, unspecified: Secondary | ICD-10-CM | POA: Insufficient documentation

## 2020-08-10 DIAGNOSIS — I471 Supraventricular tachycardia: Secondary | ICD-10-CM | POA: Diagnosis not present

## 2020-08-10 LAB — ECHOCARDIOGRAM COMPLETE
Area-P 1/2: 3.46 cm2
S' Lateral: 3.6 cm

## 2020-08-14 ENCOUNTER — Other Ambulatory Visit (HOSPITAL_BASED_OUTPATIENT_CLINIC_OR_DEPARTMENT_OTHER): Payer: Self-pay

## 2020-08-15 ENCOUNTER — Encounter: Payer: Self-pay | Admitting: Cardiology

## 2020-08-15 ENCOUNTER — Other Ambulatory Visit: Payer: Self-pay

## 2020-08-15 ENCOUNTER — Other Ambulatory Visit (HOSPITAL_BASED_OUTPATIENT_CLINIC_OR_DEPARTMENT_OTHER): Payer: Self-pay

## 2020-08-15 ENCOUNTER — Ambulatory Visit: Payer: No Typology Code available for payment source | Admitting: Cardiology

## 2020-08-15 VITALS — BP 118/80 | HR 79 | Ht 70.5 in | Wt 235.0 lb

## 2020-08-15 DIAGNOSIS — R5383 Other fatigue: Secondary | ICD-10-CM

## 2020-08-15 DIAGNOSIS — R55 Syncope and collapse: Secondary | ICD-10-CM

## 2020-08-15 DIAGNOSIS — I471 Supraventricular tachycardia: Secondary | ICD-10-CM

## 2020-08-15 DIAGNOSIS — E669 Obesity, unspecified: Secondary | ICD-10-CM | POA: Diagnosis not present

## 2020-08-15 MED ORDER — METOPROLOL SUCCINATE ER 25 MG PO TB24
12.5000 mg | ORAL_TABLET | Freq: Every day | ORAL | 3 refills | Status: DC
  Filled 2020-08-15: qty 45, 90d supply, fill #0

## 2020-08-15 NOTE — Patient Instructions (Signed)
Medication Instructions:  Your physician has recommended you make the following change in your medication:  STOP: Current Toprol-XL dose START: Toprol-XL 12.5 mg once daily  *If you need a refill on your cardiac medications before your next appointment, please call your pharmacy*   Lab Work: Your physician recommends that you return for lab work in:  TODAY: Vitamin D  If you have labs (blood work) drawn today and your tests are completely normal, you will receive your results only by: [place] (if you have MyChart) OR A paper copy in the mail If you have any lab test that is abnormal or we need to change your treatment, we will call you to review the results.   Testing/Procedures:    Follow-Up: At [facility], you and your health needs are our priority.  As part of our continuing mission to provide you with exceptional heart care, we have created designated Provider Care Teams.  These Care Teams include your primary Cardiologist (physician) and Advanced Practice Providers (APPs -  Physician Assistants and Nurse Practitioners) who all work together to provide you with the care you need, when you need it.  We recommend signing up for the patient portal called "MyChart".  Sign up information is provided on this After Visit Summary.  MyChart is used to connect with patients for Virtual Visits (Telemedicine).  Patients are able to view lab/test results, encounter notes, upcoming appointments, etc.  Non-urgent messages can be sent to your provider as well.   To learn more about what you can do with MyChart, go to [email or web address].    Your next appointment:   8-10 weeks  The format for your next appointment:   In Person  Provider:   Berniece Salines, DO   Other Instructions

## 2020-08-15 NOTE — Progress Notes (Signed)
Cardiology Office Note:    Date:  08/16/2020   ID:  TIMBER LUCARELLI, DOB 12/03/1980, MRN [id number]  PCP:  Silverio Decamp, MD  Cardiologist:  Berniece Salines, DO  Electrophysiologist:  None   Referring MD: Silverio Decamp,*   " I had a syncope episode"  History of Present Illness:    Angel Marshall is a 40 y.o. male with a hx of GERD and recent syncope episodes here today to establish cardiac care.  The patient tells me about couple weeks ago he experienced an episode where he was sitting on a regular day playing video games.  And did not consider himself dehydrated although he had playing volleyball earlier that day.  He notes that he was sitting and when he turned his head he immediately felt rapid onset of fast heartbeat.  He states that with this he felt as if he was experiencing tunnel vision and he had loss of consciousness.  He is not sure how long he was down for but he woke up in a chair and did not have any issues afterwards he felt normal.  At that time his COVID status was negative until the patient reports he may have had a low-grade fever.  After this episode he saw his PCP who recommended a CO monitor as well as echocardiogram.    He does wear his CO monitor which showed evidence of supraventricular tachycardia.  His echocardiogram was essentially normal.    Past Medical History:  Diagnosis Date   GERD (gastroesophageal reflux disease)     Past Surgical History:  Procedure Laterality Date   APPENDECTOMY  09/2015   LAPAROSCOPIC APPENDECTOMY N/A 09/25/2015   Procedure: APPENDECTOMY LAPAROSCOPIC;  Surgeon: Erroll Luna, MD;  Location: [facility];  Service: General;  Laterality: N/A;   UMBILECTOMY     UMBILICAL SURGERY      Current Medications: Current Meds  Medication Sig   clomiPHENE (CLOMID) 50 MG tablet TAKE 1 TABLET BY MOUTH EVERY OTHER DAY AS DIRECTED   COVID-19 At Home Antigen Test (CARESTART COVID-19 HOME TEST) KIT Use as directed within package  instructions (Patient taking differently: Use as directed within package instructions)   famotidine (PEPCID) 20 MG tablet TAKE 2 TABLETS (40 MG TOTAL) BY MOUTH 2 (TWO) TIMES DAILY.   fluticasone (FLONASE) 50 MCG/ACT nasal spray PLACE 1 SPRAY INTO EACH NOSTRIL TWO TIMES DAILY. USE THE LEFT HAND FOR THE RIGHT NOSTRIL AND USE THE RIGHT HAND FOR THE LEFT NOSTRIL   methocarbamol (ROBAXIN) 500 MG tablet Take 1 tablet (500 mg total) by mouth 3 (three) times daily.   metoprolol succinate (TOPROL XL) 25 MG 24 hr tablet Take 0.5 tablets (12.5 mg total) by mouth daily.   Multiple Vitamin (MULTIVITAMIN) capsule Take by mouth.   omega-3 acid ethyl esters (LOVAZA) 1 g capsule Take 2 capsules (2 g total) by mouth 2 (two) times daily.   omeprazole (PRILOSEC) 10 MG capsule TAKE 4 CAPSULES (40 MG TOTAL) BY MOUTH DAILY.   rosuvastatin (CRESTOR) 10 MG tablet Take 1 tablet (10 mg total) by mouth daily.   Semaglutide, 1 MG/DOSE, 4 MG/3ML SOPN INJECT 1 MG INTO THE SKIN ONCE A WEEK.   terbinafine (LAMISIL) 250 MG tablet TAKE 1 TABLET (250 MG TOTAL) BY MOUTH DAILY.   [DISCONTINUED] metoprolol succinate (TOPROL XL) 50 MG 24 hr tablet Take 1 tablet (50 mg total) by mouth daily. Take with or immediately following a meal.     Allergies:   Patient has no known  allergies.   Social History   Socioeconomic History   Marital status: Married    Spouse name: Levy Sjogren   Number of children: 3   Years of education: Not on file   Highest education level: Doctorate  Occupational History   Occupation: Marine scientist: [employer]  Tobacco Use   Smoking status: Never   Smokeless tobacco: Never  Substance and Sexual Activity   Alcohol use: No    Alcohol/week: 0.0 standard drinks   Drug use: No   Sexual activity: Not on file  Other Topics Concern   Not on file  Social History Narrative   Not on file   Social Determinants of Health   Financial Resource Strain: Not on file  Food Insecurity: Not on file   Transportation Needs: Not on file  Physical Activity: Not on file  Stress: Not on file  Social Connections: Not on file     Family History: The patient's family history includes Cancer in his mother; Hyperlipidemia in his father and mother. There is no history of Diabetes, Heart attack, Hypertension, or Sudden death.  ROS:   Review of Systems  Constitution: Negative for decreased appetite, fever and weight gain.  HENT: Negative for congestion, ear discharge, hoarse voice and sore throat.   Eyes: Negative for discharge, redness, vision loss in right eye and visual halos.  Cardiovascular: Negative for chest pain, dyspnea on exertion, leg swelling, orthopnea and palpitations.  Respiratory: Negative for cough, hemoptysis, shortness of breath and snoring.   Endocrine: Negative for heat intolerance and polyphagia.  Hematologic/Lymphatic: Negative for bleeding problem. Does not bruise/bleed easily.  Skin: Negative for flushing, nail changes, rash and suspicious lesions.  Musculoskeletal: Negative for arthritis, joint pain, muscle cramps, myalgias, neck pain and stiffness.  Gastrointestinal: Negative for abdominal pain, bowel incontinence, diarrhea and excessive appetite.  Genitourinary: Negative for decreased libido, genital sores and incomplete emptying.  Neurological: Negative for brief paralysis, focal weakness, headaches and loss of balance.  Psychiatric/Behavioral: Negative for altered mental status, depression and suicidal ideas.  Allergic/Immunologic: Negative for HIV exposure and persistent infections.    EKGs/Labs/Other Studies Reviewed:    The following studies were reviewed today:   EKG:  None today  TTE 08/10/2020 IMPRESSIONS     1. Left ventricular ejection fraction, by estimation, is 60 to 65%. Left  ventricular ejection fraction by 3D volume is 63 %. The left ventricle has  normal function. The left ventricle has no regional wall motion  abnormalities. Left  ventricular diastolic   parameters were normal.   2. RV free wall strain -27.4 which is normal. Right ventricular systolic  function is normal. The right ventricular size is normal. Tricuspid  regurgitation signal is inadequate for assessing PA pressure.   3. The mitral valve is grossly normal. Trivial mitral valve  regurgitation. No evidence of mitral stenosis.   4. The aortic valve is tricuspid. Aortic valve regurgitation is not  visualized. No aortic stenosis is present.   5. The inferior vena cava is normal in size with greater than 50%  respiratory variability, suggesting right atrial pressure of 3 mmHg.   Conclusion(s)/Recommendation(s): Normal biventricular function without  evidence of hemodynamically significant valvular heart disease.   FINDINGS   Left Ventricle: Left ventricular ejection fraction, by estimation, is 60  to 65%. Left ventricular ejection fraction by 3D volume is 63 %. The left  ventricle has normal function. The left ventricle has no regional wall  motion abnormalities. The left  ventricular internal  cavity size was normal in size. There is no left  ventricular hypertrophy. Left ventricular diastolic parameters were  normal.   Right Ventricle: RV free wall strain -27.4 which is normal. The right  ventricular size is normal. No increase in right ventricular wall  thickness. Right ventricular systolic function is normal. Tricuspid  regurgitation signal is inadequate for assessing  PA pressure.   Left Atrium: LA strain reservoir 36.8% which is normal. Left atrial size  was normal in size.   Right Atrium: Right atrial size was normal in size.   Pericardium: There is no evidence of pericardial effusion.   Mitral Valve: The mitral valve is grossly normal. Trivial mitral valve  regurgitation. No evidence of mitral valve stenosis.   Tricuspid Valve: The tricuspid valve is grossly normal. Tricuspid valve  regurgitation is not demonstrated. No evidence of  tricuspid stenosis.   Aortic Valve: The aortic valve is tricuspid. Aortic valve regurgitation is  not visualized. No aortic stenosis is present.   Pulmonic Valve: The pulmonic valve was grossly normal. Pulmonic valve  regurgitation is not visualized. No evidence of pulmonic stenosis.   Aorta: The aortic root and ascending aorta are structurally normal, with  no evidence of dilitation.   Venous: The inferior vena cava is normal in size with greater than 50%  respiratory variability, suggesting right atrial pressure of 3 mmHg.   IAS/Shunts: The atrial septum is grossly normal.   Zio Monitor  Patch Wear Time:  13 days and 20 hours (2022-05-31T17:13:54-0400 to 2022-06-14T14:10:37-0400) Patient had a min HR of 52 bpm, max HR of 210 bpm, and avg HR of 86 bpm. Predominant underlying rhythm was Sinus Rhythm. 5 Supraventricular Tachycardia runs occurred, the run with the fastest interval lasting 10 beats with a max rate of 210 bpm, the longest lasting 13 beats with an avg rate of 116 bpm. Supraventricular Tachycardia was detected within +/- 45 seconds of symptomatic patient event(s). Isolated SVEs were rare (<1.0%), SVE Couplets were rare (<1.0%), and SVE Triplets were rare (<1.0%). Isolated VEs were rare (<1.0%), VE Couplets were rare (<1.0%), and no VE Triplets were present.   Summary:  NSR average HR 86 bpm. Short bursts of SVT  Recent Labs: 07/04/2020: ALT 29; Brain Natriuretic Peptide 32; BUN 17; Creat 1.17; Hemoglobin 15.2; Platelets 239; Potassium 4.3; Sodium 141; TSH 2.73  Recent Lipid Panel    Component Value Date/Time   CHOL 126 12/20/2019 0846   CHOL 129 06/15/2017 0000   TRIG 67 12/20/2019 0846   HDL 31 (L) 12/20/2019 0846   HDL 39 (L) 06/15/2017 0000   CHOLHDL 4.1 12/20/2019 0846   VLDL 8.8 01/22/2016 0818   LDLCALC 81 12/20/2019 0846    Physical Exam:    VS:  BP 118/80 (BP Location: Left Arm, Patient Position: Sitting)   Pulse 79   Ht 5' 10.5" (1.791 m)   Wt 235 lb 0.6  oz (106.6 kg)   SpO2 96%   BMI 33.25 kg/m     Wt Readings from Last 3 Encounters:  08/15/20 235 lb 0.6 oz (106.6 kg)  07/04/20 230 lb (104.3 kg)  12/14/19 227 lb (103 kg)     GEN: Well nourished, well developed in no acute distress HEENT: Normal NECK: No JVD; No carotid bruits LYMPHATICS: No lymphadenopathy CARDIAC: S1S2 noted,RRR, no murmurs, rubs, gallops RESPIRATORY:  Clear to auscultation without rales, wheezing or rhonchi  ABDOMEN: Soft, non-tender, non-distended, +bowel sounds, no guarding. EXTREMITIES: No edema, No cyanosis, no clubbing MUSCULOSKELETAL:  No deformity  SKIN: Warm and dry NEUROLOGIC:  Alert and oriented x 3, non-focal PSYCHIATRIC:  Normal affect, good insight  ASSESSMENT:    1. SVT (supraventricular tachycardia) ([place])   2. Fatigue, unspecified type   3. Syncope, unspecified syncope type   4. Obesity (BMI 30-39.9)    PLAN:    1.  We talked about his event monitor findings with supraventricular tachycardia which I suspect is atrial tachycardia.  We will start the patient on low-dose beta-blocker 12.5 metoprolol succinate.  He had previously been started on 50 mg of metoprolol succinate which she had not started yet but I think that starting low with his beta-blocker will make a difference here.  2.  He reports great deal of fatigue I am getting his vitamin D level on this patient.  3 no recurrent syncope episodes we will continue to monitor no need for any further At this time.  4.  The patient understands the need to lose weight with diet and exercise. We have discussed specific strategies for this.  5.  Hyperlipidemia - continue with current statin medication.  The patient is in agreement with the above plan. The patient left the office in stable condition.  The patient will follow up in 8 to 10 weeks due to medication changes.   Medication Adjustments/Labs and Tests Ordered: Current medicines are reviewed at length with the patient today.   Concerns regarding medicines are outlined above.  Orders Placed This Encounter  Procedures   VITAMIN D 25 Hydroxy (Vit-D Deficiency, Fractures)   Meds ordered this encounter  Medications   metoprolol succinate (TOPROL XL) 25 MG 24 hr tablet    Sig: Take 0.5 tablets (12.5 mg total) by mouth daily.    Dispense:  45 tablet    Refill:  3    Patient Instructions  Medication Instructions:  Your physician has recommended you make the following change in your medication:  STOP: Current Toprol-XL dose START: Toprol-XL 12.5 mg once daily  *If you need a refill on your cardiac medications before your next appointment, please call your pharmacy*   Lab Work: Your physician recommends that you return for lab work in:  TODAY: Vitamin D  If you have labs (blood work) drawn today and your tests are completely normal, you will receive your results only by: [place] (if you have MyChart) OR A paper copy in the mail If you have any lab test that is abnormal or we need to change your treatment, we will call you to review the results.   Testing/Procedures:    Follow-Up: At [facility], you and your health needs are our priority.  As part of our continuing mission to provide you with exceptional heart care, we have created designated Provider Care Teams.  These Care Teams include your primary Cardiologist (physician) and Advanced Practice Providers (APPs -  Physician Assistants and Nurse Practitioners) who all work together to provide you with the care you need, when you need it.  We recommend signing up for the patient portal called "MyChart".  Sign up information is provided on this After Visit Summary.  MyChart is used to connect with patients for Virtual Visits (Telemedicine).  Patients are able to view lab/test results, encounter notes, upcoming appointments, etc.  Non-urgent messages can be sent to your provider as well.   To learn more about what you can do with MyChart, go to  [email or web address].    Your next appointment:   8-10 weeks  The format for your next appointment:  In Person  Provider:   Berniece Salines, DO   Other Instructions    Adopting a Healthy Lifestyle.  Know what a healthy weight is for you (roughly BMI <25) and aim to maintain this   Aim for 7+ servings of fruits and vegetables daily   65-80+ fluid ounces of water or unsweet tea for healthy kidneys   Limit to max 1 drink of alcohol per day; avoid smoking/tobacco   Limit animal fats in diet for cholesterol and heart health - choose grass fed whenever available   Avoid highly processed foods, and foods high in saturated/trans fats   Aim for low stress - take time to unwind and care for your mental health   Aim for 150 min of moderate intensity exercise weekly for heart health, and weights twice weekly for bone health   Aim for 7-9 hours of sleep daily   When it comes to diets, agreement about the perfect plan isnt easy to find, even among the experts. Experts at the [place] developed an idea known as the Healthy Eating Plate. Just imagine a plate divided into logical, healthy portions.   The emphasis is on diet quality:   Load up on vegetables and fruits - one-half of your plate: Aim for color and variety, and remember that potatoes dont count.   Go for whole grains - one-quarter of your plate: Whole wheat, barley, wheat berries, quinoa, oats, Hasler rice, and foods made with them. If you want pasta, go with whole wheat pasta.   Protein power - one-quarter of your plate: Fish, chicken, beans, and nuts are all healthy, versatile protein sources. Limit red meat.   The diet, however, does go beyond the plate, offering a few other suggestions.   Use healthy plant oils, such as olive, canola, soy, corn, sunflower and peanut. Check the labels, and avoid partially hydrogenated oil, which have unhealthy trans fats.   If youre thirsty, drink water. Coffee  and tea are good in moderation, but skip sugary drinks and limit milk and dairy products to one or two daily servings.   The type of carbohydrate in the diet is more important than the amount. Some sources of carbohydrates, such as vegetables, fruits, whole grains, and beans-are healthier than others.   Finally, stay active  Signed, Berniece Salines, DO  08/16/2020 10:24 AM    [place]

## 2020-08-16 DIAGNOSIS — R5383 Other fatigue: Secondary | ICD-10-CM | POA: Insufficient documentation

## 2020-08-16 DIAGNOSIS — E669 Obesity, unspecified: Secondary | ICD-10-CM | POA: Insufficient documentation

## 2020-08-16 DIAGNOSIS — I471 Supraventricular tachycardia: Secondary | ICD-10-CM | POA: Insufficient documentation

## 2020-08-16 DIAGNOSIS — R55 Syncope and collapse: Secondary | ICD-10-CM | POA: Insufficient documentation

## 2020-08-16 LAB — VITAMIN D 25 HYDROXY (VIT D DEFICIENCY, FRACTURES): Vit D, 25-Hydroxy: 50.7 ng/mL (ref 30.0–100.0)

## 2020-08-16 NOTE — Addendum Note (Signed)
Addended by: Darius Bump B on: 08/16/2020 02:29 PM   Modules accepted: Orders

## 2020-08-23 ENCOUNTER — Other Ambulatory Visit (HOSPITAL_BASED_OUTPATIENT_CLINIC_OR_DEPARTMENT_OTHER): Payer: Self-pay

## 2020-08-24 ENCOUNTER — Other Ambulatory Visit (HOSPITAL_BASED_OUTPATIENT_CLINIC_OR_DEPARTMENT_OTHER): Payer: Self-pay

## 2020-09-04 ENCOUNTER — Other Ambulatory Visit (HOSPITAL_BASED_OUTPATIENT_CLINIC_OR_DEPARTMENT_OTHER): Payer: Self-pay

## 2020-09-10 ENCOUNTER — Other Ambulatory Visit (HOSPITAL_BASED_OUTPATIENT_CLINIC_OR_DEPARTMENT_OTHER): Payer: Self-pay

## 2020-09-12 ENCOUNTER — Other Ambulatory Visit (HOSPITAL_BASED_OUTPATIENT_CLINIC_OR_DEPARTMENT_OTHER): Payer: Self-pay

## 2020-09-13 ENCOUNTER — Other Ambulatory Visit (HOSPITAL_BASED_OUTPATIENT_CLINIC_OR_DEPARTMENT_OTHER): Payer: Self-pay

## 2020-09-13 ENCOUNTER — Other Ambulatory Visit (HOSPITAL_COMMUNITY): Payer: Self-pay

## 2020-10-01 ENCOUNTER — Other Ambulatory Visit (HOSPITAL_BASED_OUTPATIENT_CLINIC_OR_DEPARTMENT_OTHER): Payer: Self-pay

## 2020-10-01 MED ORDER — CARESTART COVID-19 HOME TEST VI KIT
PACK | 0 refills | Status: DC
  Filled 2020-10-01: qty 4, 4d supply, fill #0

## 2020-10-11 ENCOUNTER — Other Ambulatory Visit (HOSPITAL_BASED_OUTPATIENT_CLINIC_OR_DEPARTMENT_OTHER): Payer: Self-pay

## 2020-10-16 ENCOUNTER — Ambulatory Visit: Payer: No Typology Code available for payment source | Admitting: Cardiology

## 2020-10-22 ENCOUNTER — Other Ambulatory Visit (HOSPITAL_BASED_OUTPATIENT_CLINIC_OR_DEPARTMENT_OTHER): Payer: Self-pay

## 2020-10-22 MED ORDER — INFLUENZA VAC SPLIT QUAD 0.5 ML IM SUSY
PREFILLED_SYRINGE | INTRAMUSCULAR | 0 refills | Status: DC
  Filled 2020-10-22: qty 0.5, 1d supply, fill #0

## 2020-10-29 ENCOUNTER — Other Ambulatory Visit (HOSPITAL_BASED_OUTPATIENT_CLINIC_OR_DEPARTMENT_OTHER): Payer: Self-pay

## 2020-11-02 ENCOUNTER — Other Ambulatory Visit (HOSPITAL_BASED_OUTPATIENT_CLINIC_OR_DEPARTMENT_OTHER): Payer: Self-pay

## 2020-11-05 ENCOUNTER — Other Ambulatory Visit (HOSPITAL_BASED_OUTPATIENT_CLINIC_OR_DEPARTMENT_OTHER): Payer: Self-pay

## 2020-11-12 ENCOUNTER — Other Ambulatory Visit (HOSPITAL_BASED_OUTPATIENT_CLINIC_OR_DEPARTMENT_OTHER): Payer: Self-pay

## 2020-11-23 ENCOUNTER — Other Ambulatory Visit (HOSPITAL_BASED_OUTPATIENT_CLINIC_OR_DEPARTMENT_OTHER): Payer: Self-pay

## 2020-11-23 MED ORDER — INFLUENZA VAC SPLIT QUAD 0.5 ML IM SUSY
PREFILLED_SYRINGE | INTRAMUSCULAR | 0 refills | Status: DC
  Filled 2020-11-23: qty 0.5, 1d supply, fill #0

## 2020-11-29 ENCOUNTER — Other Ambulatory Visit (HOSPITAL_BASED_OUTPATIENT_CLINIC_OR_DEPARTMENT_OTHER): Payer: Self-pay

## 2020-11-29 MED FILL — Terbinafine HCl Tab 250 MG: ORAL | 90 days supply | Qty: 90 | Fill #1 | Status: AC

## 2020-12-18 ENCOUNTER — Other Ambulatory Visit (HOSPITAL_COMMUNITY): Payer: Self-pay

## 2021-01-01 ENCOUNTER — Other Ambulatory Visit: Payer: Self-pay | Admitting: Sports Medicine

## 2021-01-02 ENCOUNTER — Other Ambulatory Visit (HOSPITAL_BASED_OUTPATIENT_CLINIC_OR_DEPARTMENT_OTHER): Payer: Self-pay

## 2021-01-02 ENCOUNTER — Other Ambulatory Visit: Payer: Self-pay | Admitting: Sports Medicine

## 2021-01-02 ENCOUNTER — Other Ambulatory Visit: Payer: Self-pay

## 2021-01-02 MED ORDER — OMEPRAZOLE 10 MG PO CPDR
40.0000 mg | DELAYED_RELEASE_CAPSULE | Freq: Every day | ORAL | 3 refills | Status: DC
  Filled 2021-01-02: qty 50, 12d supply, fill #0
  Filled 2021-01-02: qty 310, 78d supply, fill #0
  Filled 2021-11-18 (×2): qty 120, 30d supply, fill #1
  Filled ????-??-??: fill #1

## 2021-01-04 ENCOUNTER — Other Ambulatory Visit (HOSPITAL_BASED_OUTPATIENT_CLINIC_OR_DEPARTMENT_OTHER): Payer: Self-pay

## 2021-01-10 ENCOUNTER — Other Ambulatory Visit (HOSPITAL_BASED_OUTPATIENT_CLINIC_OR_DEPARTMENT_OTHER): Payer: Self-pay

## 2021-01-15 ENCOUNTER — Encounter: Payer: No Typology Code available for payment source | Admitting: Sports Medicine

## 2021-01-16 ENCOUNTER — Other Ambulatory Visit (HOSPITAL_BASED_OUTPATIENT_CLINIC_OR_DEPARTMENT_OTHER): Payer: Self-pay

## 2021-01-29 ENCOUNTER — Other Ambulatory Visit (HOSPITAL_BASED_OUTPATIENT_CLINIC_OR_DEPARTMENT_OTHER): Payer: Self-pay

## 2021-02-11 ENCOUNTER — Other Ambulatory Visit (HOSPITAL_BASED_OUTPATIENT_CLINIC_OR_DEPARTMENT_OTHER): Payer: Self-pay

## 2021-02-12 ENCOUNTER — Other Ambulatory Visit: Payer: Self-pay

## 2021-02-12 ENCOUNTER — Ambulatory Visit (INDEPENDENT_AMBULATORY_CARE_PROVIDER_SITE_OTHER): Payer: No Typology Code available for payment source | Admitting: Sports Medicine

## 2021-02-12 ENCOUNTER — Other Ambulatory Visit (HOSPITAL_BASED_OUTPATIENT_CLINIC_OR_DEPARTMENT_OTHER): Payer: Self-pay

## 2021-02-12 VITALS — BP 120/80 | HR 78 | Wt 231.0 lb

## 2021-02-12 DIAGNOSIS — Z0000 Encounter for general adult medical examination without abnormal findings: Secondary | ICD-10-CM | POA: Diagnosis not present

## 2021-02-12 DIAGNOSIS — E782 Mixed hyperlipidemia: Secondary | ICD-10-CM | POA: Diagnosis not present

## 2021-02-12 DIAGNOSIS — R5383 Other fatigue: Secondary | ICD-10-CM

## 2021-02-12 DIAGNOSIS — E8881 Metabolic syndrome: Secondary | ICD-10-CM

## 2021-02-12 MED ORDER — METOPROLOL SUCCINATE ER 25 MG PO TB24
12.5000 mg | ORAL_TABLET | Freq: Every day | ORAL | 3 refills | Status: DC
Start: 2021-02-12 — End: 2021-06-11
  Filled 2021-02-12: qty 45, 90d supply, fill #0

## 2021-02-12 MED ORDER — FLUTICASONE PROPIONATE 50 MCG/ACT NA SUSP
NASAL | 3 refills | Status: AC
  Filled 2021-02-12: qty 48, 90d supply, fill #0

## 2021-02-12 MED ORDER — ROSUVASTATIN CALCIUM 10 MG PO TABS
10.0000 mg | ORAL_TABLET | Freq: Every day | ORAL | 3 refills | Status: DC
  Filled 2021-02-12: qty 90, 90d supply, fill #0
  Filled 2021-05-29: qty 90, 90d supply, fill #1
  Filled 2021-09-09: qty 90, 90d supply, fill #2
  Filled 2021-12-13: qty 90, 90d supply, fill #3

## 2021-02-12 MED ORDER — MOUNJARO 2.5 MG/0.5ML ~~LOC~~ SOAJ
2.5000 mg | SUBCUTANEOUS | 1 refills | Status: DC
  Filled 2021-02-12: qty 2, 28d supply, fill #0
  Filled 2021-03-06: qty 2, 28d supply, fill #1

## 2021-02-12 MED ORDER — FAMOTIDINE 20 MG PO TABS
40.0000 mg | ORAL_TABLET | Freq: Two times a day (BID) | ORAL | 3 refills | Status: DC
  Filled 2021-02-12: qty 360, 90d supply, fill #0
  Filled 2021-04-23: qty 120, 30d supply, fill #0
  Filled 2021-08-14: qty 120, 30d supply, fill #1
  Filled 2021-12-13: qty 120, 30d supply, fill #2

## 2021-02-12 NOTE — Progress Notes (Signed)
Subjective:    CC: Annual Physical Exam  HPI:  This patient is here for their annual physical  I reviewed the past medical history, family history, social history, surgical history, and allergies today and no changes were needed.  Please see the problem list section below in epic for further details.  Past Medical History: Past Medical History:  Diagnosis Date   GERD (gastroesophageal reflux disease)    Past Surgical History: Past Surgical History:  Procedure Laterality Date   APPENDECTOMY  09/2015   LAPAROSCOPIC APPENDECTOMY N/A 09/25/2015   Procedure: APPENDECTOMY LAPAROSCOPIC;  Surgeon: Erroll Luna, MD;  Location: [facility];  Service: General;  Laterality: N/A;   UMBILECTOMY     UMBILICAL SURGERY     Social History: Social History   Socioeconomic History   Marital status: Married    Spouse name: Levy Sjogren   Number of children: 3   Years of education: Not on file   Highest education level: Doctorate  Occupational History   Occupation: Marine scientist: [employer]  Tobacco Use   Smoking status: Never   Smokeless tobacco: Never  Substance and Sexual Activity   Alcohol use: No    Alcohol/week: 0.0 standard drinks   Drug use: No   Sexual activity: Not on file  Other Topics Concern   Not on file  Social History Narrative   Not on file   Social Determinants of Health   Financial Resource Strain: Not on file  Food Insecurity: Not on file  Transportation Needs: Not on file  Physical Activity: Not on file  Stress: Not on file  Social Connections: Not on file   Family History: Family History  Problem Relation Age of Onset   Hyperlipidemia Mother    Cancer Mother        Breast   Hyperlipidemia Father    Diabetes Neg Hx    Heart attack Neg Hx    Hypertension Neg Hx    Sudden death Neg Hx    Allergies: No Known Allergies Medications: See med rec.  Review of Systems: No headache, visual changes, nausea, vomiting, diarrhea, constipation, dizziness,  abdominal pain, skin rash, fevers, chills, night sweats, swollen lymph nodes, weight loss, chest pain, body aches, joint swelling, muscle aches, shortness of breath, mood changes, visual or auditory hallucinations.  Objective:    General: Well Developed, well nourished, and in no acute distress.  Neuro: Alert and oriented x3, extra-ocular muscles intact, sensation grossly intact. Cranial nerves II through XII are intact, motor, sensory, and coordinative functions are all intact. HEENT: Normocephalic, atraumatic, pupils equal round reactive to light, neck supple, no masses, no lymphadenopathy, thyroid nonpalpable. Oropharynx, nasopharynx, external ear canals are unremarkable. Skin: Warm and dry, no rashes noted.  Cardiac: Regular rate and rhythm, no murmurs rubs or gallops.  Respiratory: Clear to auscultation bilaterally. Not using accessory muscles, speaking in full sentences.  Abdominal: Soft, nontender, nondistended, positive bowel sounds, no masses, no organomegaly.  Musculoskeletal: Shoulder, elbow, wrist, hip, knee, ankle stable, and with full range of motion.  Impression and Recommendations:    The patient was counselled, risk factors were discussed, anticipatory guidance given.  Annual physical exam Annual physical as above, up-to-date on screening measures, checking routine labs.   Fatigue Angel Marshall has inexplicable fatigue, he has difficulty putting on lean muscle, we will check some routine labs, including testosterone. I do think we ultimately will need to pull the chart for sleep study, he does not snore, but he does not wake feeling  well rested.  Metabolic syndrome Checking routine labs, adding Mounjaro. Discontinue Ozempic.   ___________________________________________ Gwen Her. Dianah Field, M.D., ABFM., CAQSM. Primary Care and Sports Medicine [facility]  Adjunct Professor of [place] [employer]

## 2021-02-12 NOTE — Assessment & Plan Note (Signed)
Angel Marshall has inexplicable fatigue, he has difficulty putting on lean muscle, we will check some routine labs, including testosterone. I do think we ultimately will need to pull the chart for sleep study, he does not snore, but he does not wake feeling well rested.

## 2021-02-12 NOTE — Assessment & Plan Note (Signed)
Checking routine labs, adding Mounjaro. Discontinue Ozempic.

## 2021-02-12 NOTE — Assessment & Plan Note (Signed)
Annual physical as above, up-to-date on screening measures, checking routine labs.

## 2021-02-13 ENCOUNTER — Other Ambulatory Visit (HOSPITAL_BASED_OUTPATIENT_CLINIC_OR_DEPARTMENT_OTHER): Payer: Self-pay

## 2021-02-14 ENCOUNTER — Other Ambulatory Visit (HOSPITAL_BASED_OUTPATIENT_CLINIC_OR_DEPARTMENT_OTHER): Payer: Self-pay

## 2021-02-20 LAB — HEMOGLOBIN A1C
Hgb A1c MFr Bld: 5.3 % of total Hgb (ref <5.7)
Mean Plasma Glucose: 105 mg/dL
eAG (mmol/L): 5.8 mmol/L

## 2021-02-20 LAB — LIPID PANEL
Cholesterol: 125 mg/dL (ref <200)
HDL: 37 mg/dL — ABNORMAL LOW (ref >=40)
LDL Cholesterol (Calc): 74 mg/dL (calc)
Non-HDL Cholesterol (Calc): 88 mg/dL (calc) (ref <130)
Total CHOL/HDL Ratio: 3.4 (calc) (ref <5.0)
Triglycerides: 55 mg/dL (ref <150)

## 2021-02-20 LAB — T4, FREE: Free T4: 1.2 ng/dL (ref 0.8–1.8)

## 2021-02-20 LAB — COMPREHENSIVE METABOLIC PANEL
AG Ratio: 1.7 (calc) (ref 1.0–2.5)
ALT: 51 U/L — ABNORMAL HIGH (ref 9–46)
AST: 43 U/L — ABNORMAL HIGH (ref 10–40)
Albumin: 4.5 g/dL (ref 3.6–5.1)
Alkaline phosphatase (APISO): 88 U/L (ref 36–130)
BUN: 16 mg/dL (ref 7–25)
CO2: 30 mmol/L (ref 20–32)
Calcium: 9.9 mg/dL (ref 8.6–10.3)
Chloride: 103 mmol/L (ref 98–110)
Creat: 1.24 mg/dL (ref 0.60–1.29)
Globulin: 2.6 g/dL (calc) (ref 1.9–3.7)
Glucose, Bld: 88 mg/dL (ref 65–99)
Potassium: 4.4 mmol/L (ref 3.5–5.3)
Sodium: 141 mmol/L (ref 135–146)
Total Bilirubin: 0.7 mg/dL (ref 0.2–1.2)
Total Protein: 7.1 g/dL (ref 6.1–8.1)

## 2021-02-20 LAB — CBC
HCT: 50.1 % — ABNORMAL HIGH (ref 38.5–50.0)
Hemoglobin: 16.4 g/dL (ref 13.2–17.1)
MCH: 28.9 pg (ref 27.0–33.0)
MCHC: 32.7 g/dL (ref 32.0–36.0)
MCV: 88.4 fL (ref 80.0–100.0)
MPV: 10.6 fL (ref 7.5–12.5)
Platelets: 221 10*3/uL (ref 140–400)
RBC: 5.67 10*6/uL (ref 4.20–5.80)
RDW: 12 % (ref 11.0–15.0)
WBC: 8.1 10*3/uL (ref 3.8–10.8)

## 2021-02-20 LAB — TESTOSTERONE, FREE & TOTAL
Free Testosterone: 62.1 pg/mL (ref 35.0–155.0)
Testosterone, Total, LC-MS-MS: 535 ng/dL (ref 250–1100)

## 2021-02-20 LAB — T3, FREE: T3, Free: 3.1 pg/mL (ref 2.3–4.2)

## 2021-02-20 LAB — VITAMIN B12: Vitamin B-12: 786 pg/mL (ref 200–1100)

## 2021-02-20 LAB — TSH: TSH: 3.1 mIU/L (ref 0.40–4.50)

## 2021-02-20 LAB — VITAMIN D 25 HYDROXY (VIT D DEFICIENCY, FRACTURES): Vit D, 25-Hydroxy: 51 ng/mL (ref 30–100)

## 2021-02-20 LAB — CORTISOL, FREE: Cortisol Free, Ser: 0.5 ug/dL

## 2021-02-20 LAB — LIPOPROTEIN A (LPA): Lipoprotein (a): <10 nmol/L (ref <75)

## 2021-03-04 ENCOUNTER — Other Ambulatory Visit (HOSPITAL_BASED_OUTPATIENT_CLINIC_OR_DEPARTMENT_OTHER): Payer: Self-pay

## 2021-03-04 MED ORDER — CARESTART COVID-19 HOME TEST VI KIT
PACK | 0 refills | Status: DC
  Filled 2021-03-04: qty 2, 4d supply, fill #0

## 2021-03-06 ENCOUNTER — Other Ambulatory Visit (HOSPITAL_BASED_OUTPATIENT_CLINIC_OR_DEPARTMENT_OTHER): Payer: Self-pay

## 2021-03-07 ENCOUNTER — Other Ambulatory Visit (HOSPITAL_COMMUNITY): Payer: Self-pay

## 2021-03-14 ENCOUNTER — Other Ambulatory Visit: Payer: Self-pay

## 2021-03-14 ENCOUNTER — Ambulatory Visit (INDEPENDENT_AMBULATORY_CARE_PROVIDER_SITE_OTHER): Payer: No Typology Code available for payment source | Admitting: Sports Medicine

## 2021-03-14 ENCOUNTER — Other Ambulatory Visit (HOSPITAL_BASED_OUTPATIENT_CLINIC_OR_DEPARTMENT_OTHER): Payer: Self-pay

## 2021-03-14 DIAGNOSIS — E669 Obesity, unspecified: Secondary | ICD-10-CM | POA: Diagnosis not present

## 2021-03-14 MED ORDER — MOUNJARO 5 MG/0.5ML ~~LOC~~ SOAJ
5.0000 mg | SUBCUTANEOUS | 0 refills | Status: DC
  Filled 2021-03-14: qty 2, 28d supply, fill #0

## 2021-03-14 MED ORDER — MOUNJARO 10 MG/0.5ML ~~LOC~~ SOAJ
10.0000 mg | SUBCUTANEOUS | 0 refills | Status: DC
  Filled 2021-03-14: qty 2, 28d supply, fill #0

## 2021-03-14 MED ORDER — MOUNJARO 7.5 MG/0.5ML ~~LOC~~ SOAJ
7.5000 mg | SUBCUTANEOUS | 0 refills | Status: DC
  Filled 2021-03-14: qty 2, 28d supply, fill #0

## 2021-03-14 NOTE — Progress Notes (Signed)
° ° °  Procedures performed today:    None.  Independent interpretation of notes and tests performed by another provider:   None.  Brief History, Exam, Impression, and Recommendations:    Obesity (BMI 30-39.9) I think we have finally found the silver bullet, then has lost 13 pounds on the first month of Mounjaro low-dose, we will go ahead and dose escalate with the next 3 months. Return to see me in 3 to 4 months for reevaluation.    ___________________________________________ Gwen Her. Dianah Field, M.D., ABFM., CAQSM. Primary Care and [place] Instructor of [place] of [facility] [employer]

## 2021-03-14 NOTE — Assessment & Plan Note (Signed)
I think we have finally found the silver bullet, then has lost 13 pounds on the first month of Mounjaro low-dose, we will go ahead and dose escalate with the next 3 months. Return to see me in 3 to 4 months for reevaluation.

## 2021-03-15 ENCOUNTER — Other Ambulatory Visit (HOSPITAL_BASED_OUTPATIENT_CLINIC_OR_DEPARTMENT_OTHER): Payer: Self-pay

## 2021-03-18 ENCOUNTER — Other Ambulatory Visit (HOSPITAL_BASED_OUTPATIENT_CLINIC_OR_DEPARTMENT_OTHER): Payer: Self-pay

## 2021-03-29 ENCOUNTER — Other Ambulatory Visit (HOSPITAL_BASED_OUTPATIENT_CLINIC_OR_DEPARTMENT_OTHER): Payer: Self-pay

## 2021-04-08 ENCOUNTER — Other Ambulatory Visit: Payer: Self-pay | Admitting: Sports Medicine

## 2021-04-08 ENCOUNTER — Other Ambulatory Visit (HOSPITAL_BASED_OUTPATIENT_CLINIC_OR_DEPARTMENT_OTHER): Payer: Self-pay

## 2021-04-08 DIAGNOSIS — E8881 Metabolic syndrome: Secondary | ICD-10-CM

## 2021-04-09 ENCOUNTER — Encounter: Payer: Self-pay | Admitting: Sports Medicine

## 2021-04-09 ENCOUNTER — Other Ambulatory Visit (HOSPITAL_BASED_OUTPATIENT_CLINIC_OR_DEPARTMENT_OTHER): Payer: Self-pay

## 2021-04-09 DIAGNOSIS — E669 Obesity, unspecified: Secondary | ICD-10-CM

## 2021-04-09 MED ORDER — MOUNJARO 2.5 MG/0.5ML ~~LOC~~ SOAJ
2.5000 mg | SUBCUTANEOUS | 11 refills | Status: DC
  Filled 2021-04-09: qty 2, 28d supply, fill #0
  Filled 2021-04-23: qty 2, 28d supply, fill #1
  Filled 2021-05-01: qty 6, 84d supply, fill #1

## 2021-04-17 ENCOUNTER — Other Ambulatory Visit (HOSPITAL_BASED_OUTPATIENT_CLINIC_OR_DEPARTMENT_OTHER): Payer: Self-pay

## 2021-04-18 ENCOUNTER — Other Ambulatory Visit (HOSPITAL_BASED_OUTPATIENT_CLINIC_OR_DEPARTMENT_OTHER): Payer: Self-pay

## 2021-04-23 ENCOUNTER — Other Ambulatory Visit (HOSPITAL_BASED_OUTPATIENT_CLINIC_OR_DEPARTMENT_OTHER): Payer: Self-pay

## 2021-04-23 ENCOUNTER — Other Ambulatory Visit: Payer: Self-pay | Admitting: Sports Medicine

## 2021-04-23 DIAGNOSIS — B351 Tinea unguium: Secondary | ICD-10-CM

## 2021-04-23 MED ORDER — TERBINAFINE HCL 250 MG PO TABS
ORAL_TABLET | Freq: Every day | ORAL | 3 refills | Status: DC
  Filled 2021-04-23: qty 30, 30d supply, fill #0
  Filled 2021-05-29: qty 30, 30d supply, fill #1

## 2021-05-01 ENCOUNTER — Other Ambulatory Visit (HOSPITAL_BASED_OUTPATIENT_CLINIC_OR_DEPARTMENT_OTHER): Payer: Self-pay

## 2021-05-03 ENCOUNTER — Other Ambulatory Visit (HOSPITAL_BASED_OUTPATIENT_CLINIC_OR_DEPARTMENT_OTHER): Payer: Self-pay

## 2021-05-07 ENCOUNTER — Other Ambulatory Visit (HOSPITAL_BASED_OUTPATIENT_CLINIC_OR_DEPARTMENT_OTHER): Payer: Self-pay

## 2021-05-07 MED ORDER — CARESTART COVID-19 HOME TEST VI KIT
PACK | 0 refills | Status: DC
  Filled 2021-05-07: qty 2, 4d supply, fill #0

## 2021-05-29 ENCOUNTER — Other Ambulatory Visit (HOSPITAL_BASED_OUTPATIENT_CLINIC_OR_DEPARTMENT_OTHER): Payer: Self-pay

## 2021-05-31 ENCOUNTER — Other Ambulatory Visit (HOSPITAL_BASED_OUTPATIENT_CLINIC_OR_DEPARTMENT_OTHER): Payer: Self-pay

## 2021-05-31 MED ORDER — CARESTART COVID-19 HOME TEST VI KIT
PACK | 0 refills | Status: DC
  Filled 2021-05-31: qty 4, 4d supply, fill #0

## 2021-06-05 ENCOUNTER — Other Ambulatory Visit (HOSPITAL_BASED_OUTPATIENT_CLINIC_OR_DEPARTMENT_OTHER): Payer: Self-pay

## 2021-06-05 MED ORDER — CARESTART COVID-19 HOME TEST VI KIT
PACK | 0 refills | Status: DC
Start: 2021-06-05 — End: 2021-06-11
  Filled 2021-06-05: qty 2, 4d supply, fill #0

## 2021-06-06 ENCOUNTER — Encounter: Payer: Self-pay | Admitting: Sports Medicine

## 2021-06-11 ENCOUNTER — Encounter: Payer: Self-pay | Admitting: Sports Medicine

## 2021-06-11 ENCOUNTER — Other Ambulatory Visit (HOSPITAL_BASED_OUTPATIENT_CLINIC_OR_DEPARTMENT_OTHER): Payer: Self-pay

## 2021-06-11 ENCOUNTER — Ambulatory Visit (INDEPENDENT_AMBULATORY_CARE_PROVIDER_SITE_OTHER): Payer: No Typology Code available for payment source | Admitting: Sports Medicine

## 2021-06-11 ENCOUNTER — Ambulatory Visit (INDEPENDENT_AMBULATORY_CARE_PROVIDER_SITE_OTHER): Payer: No Typology Code available for payment source

## 2021-06-11 DIAGNOSIS — M25511 Pain in right shoulder: Secondary | ICD-10-CM

## 2021-06-11 DIAGNOSIS — G8929 Other chronic pain: Secondary | ICD-10-CM | POA: Diagnosis not present

## 2021-06-11 DIAGNOSIS — B351 Tinea unguium: Secondary | ICD-10-CM | POA: Diagnosis not present

## 2021-06-11 DIAGNOSIS — E669 Obesity, unspecified: Secondary | ICD-10-CM

## 2021-06-11 MED ORDER — TRIAZOLAM 0.25 MG PO TABS
ORAL_TABLET | ORAL | 0 refills | Status: DC
  Filled 2021-06-11: qty 2, 1d supply, fill #0

## 2021-06-11 MED ORDER — WEGOVY 0.25 MG/0.5ML ~~LOC~~ SOAJ
0.2500 mg | SUBCUTANEOUS | 0 refills | Status: DC
  Filled 2021-06-11 – 2021-08-07 (×3): qty 2, 28d supply, fill #0

## 2021-06-11 MED ORDER — CICLOPIROX 8 % EX SOLN
Freq: Every day | CUTANEOUS | 11 refills | Status: DC
Start: 2021-06-11 — End: 2022-09-02
  Filled 2021-06-11: qty 6.6, 30d supply, fill #0
  Filled 2021-07-30: qty 6.6, 30d supply, fill #1

## 2021-06-11 NOTE — Assessment & Plan Note (Signed)
Angel Marshall returns, he has chronic right shoulder pain, present now for 3 to 4 years acutely, we initially suspected labral injury and cuff dysfunction, improved slightly with physician directed conservative treatment but has unfortunately recurred. ?On exam he does have a positive Neer's, Hawkins, empty can signs, positive speeds test and positive O'Brien's test, primary concern is for a labral tear so we will proceed with x-rays today and MR arthrography. ?Triazolam will be called in for preprocedural anxiolysis. ?

## 2021-06-11 NOTE — Assessment & Plan Note (Signed)
Has been almost 2 years of Lamisil, some improvement, his middle toes are still looking dystrophic. ?We will go ahead and try ciclopirox. ?Topical solution often takes 48 weeks of treatment. ?

## 2021-06-11 NOTE — Progress Notes (Signed)
? ? ?  Procedures performed today:   ? ?None. ? ?Independent interpretation of notes and tests performed by another provider:  ? ?None. ? ?Brief History, Exam, Impression, and Recommendations:   ? ?Obesity (BMI 30-39.9) ?Angel Marshall has been doing extremely well with [facility], he is lost a lot of weight, he feels great, he was unable to tolerate the 5 mg dose after 3 doses. ?Unfortunately Angel Marshall is coming off of the [employer] formulary so we will need to switch to [facility], we will go ahead and start the low-dose. ? ?Chronic right shoulder pain ?Angel Marshall returns, he has chronic right shoulder pain, present now for 3 to 4 years acutely, we initially suspected labral injury and cuff dysfunction, improved slightly with physician directed conservative treatment but has unfortunately recurred. ?On exam he does have a positive Neer's, Hawkins, empty can signs, positive speeds test and positive O'Brien's test, primary concern is for a labral tear so we will proceed with x-rays today and MR arthrography. ?Triazolam will be called in for preprocedural anxiolysis. ? ?Onychomycosis ?Has been almost 2 years of Lamisil, some improvement, his middle toes are still looking dystrophic. ?We will go ahead and try ciclopirox. ?Topical solution often takes 48 weeks of treatment. ? ? ? ?___________________________________________ ?Gwen Her. Dianah Field, M.D., ABFM., CAQSM. ?Primary Care and Sports Medicine ?[place] ? ?Adjunct Instructor of Family Medicine  ?[employer] ?

## 2021-06-11 NOTE — Assessment & Plan Note (Signed)
Angel Marshall has been doing extremely well with [facility], he is lost a lot of weight, he feels great, he was unable to tolerate the 5 mg dose after 3 doses. ?Unfortunately Angel Marshall is coming off of the [place] formulary so we will need to switch to [facility], we will go ahead and start the low-dose. ?

## 2021-06-12 ENCOUNTER — Other Ambulatory Visit (HOSPITAL_BASED_OUTPATIENT_CLINIC_OR_DEPARTMENT_OTHER): Payer: Self-pay

## 2021-06-12 ENCOUNTER — Telehealth: Payer: Self-pay

## 2021-06-12 NOTE — Telephone Encounter (Addendum)
Initiated Prior authorization BTC:YELYHT 0.'25MG'$ /0.5ML auto-injectors ?Via: Covermymeds ?Case/Key:[id number] ?Status: approved as of 06/11/21 ?Reason: The request has been approved. The authorization is effective for a maximum of 7 fills from 06/12/2021 to 01/08/2022, as long as the member is enrolled in their current health plan. The request was approved as submitted. The request was approved for 40m per 28 days.Additional prior authorizations ([place]) have been entered fMBP:[id number].'5mg'$ /0.590mallowing a maximum of 7 fills with a quantity limit of 45m77mer 28 days ([place] 7283)Wegovy '1mg'$ /0.5mL49mlowing a maximum of 7 fills with a quantity limit of 45mL 37m 28 days ([place] 7284)Wegovy 1.'7mg'$ /0.75mL 33mwing a maximum of 7 fills with a quantity limit of 3mL pe50m8 days ([place] 7285)Wegovy 2.'4mg'$ /0.75mL al14mng a maximum of 7 fills with a quantity limit of 3mL per 51mdays ([place] 7286)These authorizations are effective 06/12/2021 through 01/08/2022. ?Notified Pt via: Mychart  ?

## 2021-06-17 ENCOUNTER — Encounter: Payer: Self-pay | Admitting: Sports Medicine

## 2021-06-19 ENCOUNTER — Other Ambulatory Visit (HOSPITAL_BASED_OUTPATIENT_CLINIC_OR_DEPARTMENT_OTHER): Payer: Self-pay

## 2021-06-19 MED ORDER — COVID-19 AT HOME ANTIGEN TEST VI KIT
PACK | 0 refills | Status: DC
  Filled 2021-06-19: qty 4, 8d supply, fill #0

## 2021-06-25 ENCOUNTER — Ambulatory Visit (INDEPENDENT_AMBULATORY_CARE_PROVIDER_SITE_OTHER): Payer: No Typology Code available for payment source | Admitting: Sports Medicine

## 2021-06-25 ENCOUNTER — Ambulatory Visit (INDEPENDENT_AMBULATORY_CARE_PROVIDER_SITE_OTHER): Payer: No Typology Code available for payment source

## 2021-06-25 ENCOUNTER — Ambulatory Visit: Payer: No Typology Code available for payment source | Admitting: Sports Medicine

## 2021-06-25 DIAGNOSIS — G8929 Other chronic pain: Secondary | ICD-10-CM | POA: Diagnosis not present

## 2021-06-25 DIAGNOSIS — M25511 Pain in right shoulder: Secondary | ICD-10-CM | POA: Diagnosis not present

## 2021-06-25 NOTE — Assessment & Plan Note (Addendum)
Please see previous notes, injection performed today for MR arthrogram.  Update: Extensive rotator cuff tearing, adding some physical therapy and a consultation with Dr. Griffin Basil.

## 2021-06-25 NOTE — Progress Notes (Addendum)
    Procedures performed today:    Procedure: Real-time Ultrasound Guided gadolinium contrast injection of right glenohumeral joint Device: Samsung HS60  Verbal informed consent obtained.  Time-out conducted.  Noted no overlying erythema, induration, or other signs of local infection.  Skin prepped in a sterile fashion.  Local anesthesia: Topical Ethyl chloride.  With sterile technique and under real time ultrasound guidance: Noted normal-appearing joint, advanced needle into the joint from a posterior approach, injected 1 cc kenalog 40, 2 cc lidocaine, 2 cc bupivacaine, syringe switched and 0.1 cc gadolinium injected, syringe again switched and 10 cc sterile saline used to fully distend the joint. Joint visualized and capsule seen distending confirming intra-articular placement of contrast material and medication. Completed without difficulty  Advised to call if fevers/chills, erythema, induration, drainage, or persistent bleeding.  Images permanently stored in PACS Impression: Technically successful ultrasound guided gadolinium contrast injection for MR arthrography.  Please see separate MR arthrogram report.  Independent interpretation of notes and tests performed by another provider:   None.  Brief History, Exam, Impression, and Recommendations:    Chronic right shoulder pain Please see previous notes, injection performed today for MR arthrogram.  Update: Extensive rotator cuff tearing, adding some physical therapy and a consultation with Dr. Griffin Basil.    ___________________________________________ Gwen Her. Dianah Field, M.D., ABFM., CAQSM. Primary Care and [place] Instructor of [facility] [employer] [facility] [employer]

## 2021-06-27 ENCOUNTER — Other Ambulatory Visit (HOSPITAL_BASED_OUTPATIENT_CLINIC_OR_DEPARTMENT_OTHER): Payer: Self-pay

## 2021-06-28 NOTE — Addendum Note (Signed)
Addended by: Silverio Decamp on: 06/28/2021 10:56 AM   Modules accepted: Orders

## 2021-07-05 NOTE — Therapy (Signed)
OUTPATIENT PHYSICAL THERAPY SHOULDER EVALUATION   Patient Name: Angel Marshall MRN: [id number] DOB:12/11/1980, 41 y.o., male Today's Date: 07/15/2021   PT End of Session - 07/15/21 1621     Visit Number 1    Number of Visits 8    Date for PT Re-Evaluation 09/09/21    Authorization Type [facility]    PT Start Time 1621    PT Stop Time 1712    PT Time Calculation (min) 51 min    Activity Tolerance Patient tolerated treatment well    Behavior During Therapy WFL for tasks assessed/performed             Past Medical History:  Diagnosis Date   GERD (gastroesophageal reflux disease)    Past Surgical History:  Procedure Laterality Date   APPENDECTOMY  09/2015   LAPAROSCOPIC APPENDECTOMY N/A 09/25/2015   Procedure: APPENDECTOMY LAPAROSCOPIC;  Surgeon: Erroll Luna, MD;  Location: [place];  Service: General;  Laterality: N/A;   UMBILECTOMY     UMBILICAL SURGERY     Patient Active Problem List   Diagnosis Date Noted   Fatigue 08/16/2020   Obesity (BMI 30-39.9) 08/16/2020   GERD (gastroesophageal reflux disease) 07/23/2020   Paroxysmal supraventricular tachycardia with syncope 07/04/2020   Hypogonadism in male 95/63/8756   Metabolic syndrome 43/32/9518   Onychomycosis 04/13/2017   Mixed hyperlipidemia 04/10/2017   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   Annual physical exam 03/02/2017   Cranial nerve dysfunction 03/02/2017   Chronic right shoulder pain 03/02/2017   Lumbar degenerative disc disease 03/02/2017   Dysplastic nevus 03/02/2017   History of appendectomy 09/25/2015   Left knee pain 08/08/2015    PCP: Silverio Decamp, MD  REFERRING PROVIDER: Silverio Decamp, MD  REFERRING DIAG: [phone] (ICD-10-CM) - Chronic right shoulder pain  THERAPY DIAG:  Chronic right shoulder pain  Abnormal posture  Muscle weakness (generalized)  Other symptoms and signs involving the musculoskeletal system  RATIONALE FOR EVALUATION AND TREATMENT:  Rehabilitation  ONSET DATE: >10 yrs  SUBJECTIVE:                                                                                                                                                                                      SUBJECTIVE STATEMENT: Pt reports long standing h/o issues with his shoulder dating back at least a decade. Recent imaging positive for 40% RTC tear. Limited ROM and pain limits some ADL performance. Pain is intermittent and unpredictable. Worse with repetitive motion, esp overhead. Extensive h/o weight lifting and disc golf. Good relief noted from recent steroid injection with full return of ROM but now starting to note pain/restriction returning. Referred to surgery but  has not yet contacted the surgeon.  PAIN:  Are you having pain? No and Yes: NPRS scale: 0/10 briefly 8/10 when triggered followed by 2/10 Pain location: R superior lateral shoulder, occasionally anterior Pain description: stabbing, nerve shot on initial onset with radiation down R arm; followed by dull ache Aggravating factors: overhead work (elbow elevated), esp in ER Relieving factors: rest  PERTINENT HISTORY: Paroxysmal SVT, GERD, lumbar DDD  PRECAUTIONS: None  WEIGHT BEARING RESTRICTIONS No  FALLS:  Has patient fallen in last 6 months? No  LIVING ENVIRONMENT: Lives with: lives with their family and lives with their spouse Lives in: House/apartment Stairs: Yes Has following equipment at home: None  OCCUPATION: FT Research scientist (medical) in [place]  PLOF: Independent, Vocation/Vocational requirements: some overhead reaching with occasional heavy lifting ~20#, and Leisure: weight lifting, disc golf, playing with his kids  PATIENT GOALS "Restore function sufficiently to avoid surgery, alternatively prepare muscularly for surgery."  OBJECTIVE:   DIAGNOSTIC FINDINGS:  06/25/21 - R shoulder MRI: 1. Partial-thickness articular sided tear of the majority of the anterior greater than  posterior supraspinatus tendon, involving up to approximately 40% of the transverse tendon thickness anteriorly.  2. Mild articular side of the infraspinatus tendinosis diffusely. 3. Mild degenerative changes of the acromioclavicular joint. 06/11/21 - R shoulder x-ray: Mild acromioclavicular osteoarthritis.  PATIENT SURVEYS:  FOTO Shoulder = 63, predicted = 74  COGNITION:  Overall cognitive status: Within functional limits for tasks assessed     SENSATION: WFL Occasional radicular pain & nervy feeling but no numbness   POSTURE: Fwd rounded and protracted R>L shoulder   UPPER EXTREMITY ROM:   Active ROM Right Eval Left Eval  Shoulder flexion 148 161  Shoulder extension 48 55  Shoulder abduction 170 180  Shoulder adduction    Shoulder internal rotation 76 88  Shoulder external rotation 79 90  Elbow flexion    Elbow extension    Wrist flexion    Wrist extension    Wrist ulnar deviation    Wrist radial deviation    Wrist pronation    Wrist supination    (Blank rows = not tested)  UPPER EXTREMITY MMT:  MMT Right Eval Left Eval  Shoulder flexion 5- 5  Shoulder extension 5 5  Shoulder abduction 4+ 5  Shoulder adduction    Shoulder internal rotation 4+ 5  Shoulder external rotation 4+ 5  Middle trapezius    Lower trapezius    Elbow flexion    Elbow extension    Wrist flexion    Wrist extension    Wrist ulnar deviation    Wrist radial deviation    Wrist pronation    Wrist supination    Grip strength (lbs)    (Blank rows = not tested)  SHOULDER SPECIAL TESTS:  Impingement tests: Hawkins/Kennedy impingement test: positive   Rotator cuff assessment: Empty can test: positive  and Full can test: negative   JOINT MOBILITY TESTING:  WFL  PALPATION:  Increased muscle tension/taut bands with a few TPs in R deltoids, supra and infraspinatus   TODAY'S TREATMENT:  07/15/21 THERAPEUTIC EXERCISE: Instruction in initial HEP to improve flexibility and strength along with  postural control.  Verbal and tactile cues throughout for technique.  PATIENT EDUCATION: Education details: PT eval findings, anticipated POC, and initial HEP Person educated: Patient Education method: Explanation, Demonstration, Verbal cues, and [place] code emailed to patient Education comprehension: verbalized understanding, returned demonstration, verbal cues required, and needs further education   HOME EXERCISE PROGRAM: Access  Code: [email or web address] (emailed to patient)  Exercises - Doorway Pec Stretch at 60 Elevation  - 2-3 x daily - 7 x weekly - 3 reps - 30 sec hold - Doorway Pec Stretch at 90 Degrees Abduction  - 2-3 x daily - 7 x weekly - 3 reps - 30 sec hold - Doorway Pec Stretch at 120 Degrees Abduction  - 2-3 x daily - 7 x weekly - 3 reps - 30 sec hold - [place] on [employer]  - 2 x daily - 7 x weekly - 2 sets - 10 reps - 5 sec hold - Prone Scapular Slide with Shoulder Extension  - 1 x daily - 5 x weekly - 2 sets - 10 reps - 3 sec hold - Prone Scapular Retraction Arms at Side  - 1 x daily - 5 x weekly - 2 sets - 10 reps - 3 sec hold - Prone Scapular Retraction Y  - 1 x daily - 5 x weekly - 2 sets - 10 reps - 3 sec hold - Prone W Scapular Retraction  - 1 x daily - 5 x weekly - 2 sets - 10 reps - 3 sec hold  ASSESSMENT:  CLINICAL IMPRESSION: BRITTON BERA is a 41 y.o. male who was seen today for physical therapy evaluation and treatment for chronic R shoulder pain secondary to extensive rotator cuff tearing. Pain originated <10 yrs ago w/o known MOI. He completed some PT for his R shoulder in 2016 but none recently. Recent MRI revealing 40% tear of the supraspinatus tendon. He has been referred to surgery but has not yet contacted the surgeon. Pain and LOM limit tolerance for overhead activities affecting ADLs, some work tasks and playing with his kids. Deficits include R shoulder pain, slightly limited R shoulder AROM, abnormal posture which places shoulder at increased risk for  impingement, and mild R shoulder weakness. Marland Kitchen "Suezanne Jacquet" demonstrates good potential to benefit from skilled PT to address above deficits, improve posture and pain-free functional R shoulder ROM and strength to improve tolerance for ADLs and normal daily activities.  OBJECTIVE IMPAIRMENTS decreased activity tolerance, decreased knowledge of condition, decreased ROM, decreased strength, increased fascial restrictions, impaired perceived functional ability, increased muscle spasms, impaired flexibility, improper body mechanics, postural dysfunction, and pain.   ACTIVITY LIMITATIONS lifting and reach over head  PARTICIPATION LIMITATIONS: cleaning, community activity, occupation, yard work, and recreational activities  [place] Past/current experiences, Time since onset of injury/illness/exacerbation, and 3+ comorbidities: Paroxysmal SVT, GERD, lumbar DDD  are also affecting patient's functional outcome.   REHAB POTENTIAL: Good  CLINICAL DECISION MAKING: Stable/uncomplicated  EVALUATION COMPLEXITY: Low   GOALS: Goals reviewed with patient? Yes  SHORT TERM GOALS: Target date: 08/12/2021   Patient will be independent with initial HEP.  Baseline:  Goal status: INITIAL   LONG TERM GOALS: Target date: 09/09/2021   Patient will be independent with advanced/ongoing HEP to improve outcomes and carryover.  Baseline:  Goal status: INITIAL  2.  Patient will report 75% improvement in Right shoulder pain to improve QOL.  Baseline:  Goal status: INITIAL  3.  Patient to demonstrate improved upright posture with posterior shoulder girdle engaged to promote improved glenohumeral joint mobility. Baseline:  Goal status: INITIAL  4.  Patient to improve R shoulder AROM to WNL without pain provocation to allow for increased ease of ADLs.  Baseline:  Goal status: INITIAL  5.  Patient will demonstrate improved R shoulder strength to 5/5 for functional UE use. Baseline:  Goal  status:  INITIAL  6  Patient will report 74 on shoulder FOTO to demonstrate improved functional ability.  Baseline: 63 Goal status: INITIAL  7.  Patient to report ability to perform ADLs, household, and work-related tasks without limitation due to R shoulder pain, LOM or weakness. Baseline:  Goal status: INITIAL    PLAN: PT FREQUENCY: 1x/week to biweekly  PT DURATION: 8 weeks  PLANNED INTERVENTIONS: Therapeutic exercises, Therapeutic activity, Neuromuscular re-education, Balance training, Gait training, Patient/Family education, Joint mobilization, Dry Needling, Electrical stimulation, Cryotherapy, Moist heat, Taping, Vasopneumatic device, Ultrasound, Ionotophoresis '4mg'$ /ml Dexamethasone, Manual therapy, and Re-evaluation  PLAN FOR NEXT SESSION: Review initial HEP as needed; progress scapular strengthening and introduce gentle RTC strengthening; MT +/- DN as indicated for abnormal muscle tension/tightness   Percival Spanish, PT 07/15/2021, 6:03 PM

## 2021-07-09 ENCOUNTER — Other Ambulatory Visit: Payer: Self-pay | Admitting: Sports Medicine

## 2021-07-09 ENCOUNTER — Other Ambulatory Visit (HOSPITAL_BASED_OUTPATIENT_CLINIC_OR_DEPARTMENT_OTHER): Payer: Self-pay

## 2021-07-09 DIAGNOSIS — B351 Tinea unguium: Secondary | ICD-10-CM

## 2021-07-15 ENCOUNTER — Ambulatory Visit: Payer: No Typology Code available for payment source | Attending: Sports Medicine | Admitting: Physical Therapy

## 2021-07-15 ENCOUNTER — Other Ambulatory Visit: Payer: Self-pay

## 2021-07-15 ENCOUNTER — Encounter: Payer: Self-pay | Admitting: Physical Therapy

## 2021-07-15 DIAGNOSIS — R293 Abnormal posture: Secondary | ICD-10-CM | POA: Diagnosis present

## 2021-07-15 DIAGNOSIS — R29898 Other symptoms and signs involving the musculoskeletal system: Secondary | ICD-10-CM | POA: Diagnosis present

## 2021-07-15 DIAGNOSIS — G8929 Other chronic pain: Secondary | ICD-10-CM | POA: Insufficient documentation

## 2021-07-15 DIAGNOSIS — M6281 Muscle weakness (generalized): Secondary | ICD-10-CM | POA: Insufficient documentation

## 2021-07-15 DIAGNOSIS — M25511 Pain in right shoulder: Secondary | ICD-10-CM | POA: Insufficient documentation

## 2021-07-16 ENCOUNTER — Other Ambulatory Visit (HOSPITAL_BASED_OUTPATIENT_CLINIC_OR_DEPARTMENT_OTHER): Payer: Self-pay

## 2021-07-16 MED ORDER — CELECOXIB 100 MG PO CAPS
200.0000 mg | ORAL_CAPSULE | Freq: Two times a day (BID) | ORAL | 2 refills | Status: DC
  Filled 2021-07-16: qty 120, 30d supply, fill #0

## 2021-07-29 ENCOUNTER — Encounter: Payer: Self-pay | Admitting: Physical Therapy

## 2021-07-29 ENCOUNTER — Ambulatory Visit: Payer: No Typology Code available for payment source | Admitting: Physical Therapy

## 2021-07-29 DIAGNOSIS — G8929 Other chronic pain: Secondary | ICD-10-CM

## 2021-07-29 DIAGNOSIS — R293 Abnormal posture: Secondary | ICD-10-CM

## 2021-07-29 DIAGNOSIS — M6281 Muscle weakness (generalized): Secondary | ICD-10-CM

## 2021-07-29 DIAGNOSIS — M25511 Pain in right shoulder: Secondary | ICD-10-CM | POA: Diagnosis not present

## 2021-07-29 DIAGNOSIS — R29898 Other symptoms and signs involving the musculoskeletal system: Secondary | ICD-10-CM

## 2021-07-29 NOTE — Therapy (Signed)
OUTPATIENT PHYSICAL THERAPY TREATMENT   Patient Name: BRENTYN SEEHAFER MRN: [id number] DOB:20-Sep-1980, 41 y.o., male Today's Date: 07/29/2021   PT End of Session - 07/29/21 1616     Visit Number 2    Number of Visits 8    Date for PT Re-Evaluation 09/09/21    Authorization Type [facility]    PT Start Time 1616    PT Stop Time 1703    PT Time Calculation (min) 47 min    Activity Tolerance Patient tolerated treatment well    Behavior During Therapy WFL for tasks assessed/performed              Past Medical History:  Diagnosis Date   GERD (gastroesophageal reflux disease)    Past Surgical History:  Procedure Laterality Date   APPENDECTOMY  09/2015   LAPAROSCOPIC APPENDECTOMY N/A 09/25/2015   Procedure: APPENDECTOMY LAPAROSCOPIC;  Surgeon: Erroll Luna, MD;  Location: [place];  Service: General;  Laterality: N/A;   UMBILECTOMY     UMBILICAL SURGERY     Patient Active Problem List   Diagnosis Date Noted   Fatigue 08/16/2020   Obesity (BMI 30-39.9) 08/16/2020   GERD (gastroesophageal reflux disease) 07/23/2020   Paroxysmal supraventricular tachycardia with syncope 07/04/2020   Hypogonadism in male 28/36/6294   Metabolic syndrome 76/54/6503   Onychomycosis 04/13/2017   Mixed hyperlipidemia 04/10/2017   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   Annual physical exam 03/02/2017   Cranial nerve dysfunction 03/02/2017   Chronic right shoulder pain 03/02/2017   Lumbar degenerative disc disease 03/02/2017   Dysplastic nevus 03/02/2017   History of appendectomy 09/25/2015   Left knee pain 08/08/2015    PCP: Silverio Decamp, MD  REFERRING PROVIDER: Silverio Decamp, MD  REFERRING DIAG: [phone] (ICD-10-CM) - Chronic right shoulder pain  THERAPY DIAG:  Chronic right shoulder pain  Abnormal posture  Muscle weakness (generalized)  Other symptoms and signs involving the musculoskeletal system  RATIONALE FOR EVALUATION AND TREATMENT:  Rehabilitation  ONSET DATE: >10 yrs  SUBJECTIVE:                                                                                                                                                                                      SUBJECTIVE STATEMENT: Pt reports he played corn hole over the weekend and did okay as long as he didn't throw too high. He notes that the HEP stretches have identified some neck tightness. He states he was better able to throw while playing disc golf with less restriction following working on the HEP stretches.  PAIN:  Are you having pain? Yes: NPRS scale: 2-3/10 Pain location: R superior lateral shoulder, occasionally anterior  Pain description: dull, with a little bit of throbbing  PERTINENT HISTORY: Paroxysmal SVT, GERD, lumbar DDD  PRECAUTIONS: None  WEIGHT BEARING RESTRICTIONS No  FALLS:  Has patient fallen in last 6 months? No  LIVING ENVIRONMENT: Lives with: lives with their family and lives with their spouse Lives in: House/apartment Stairs: Yes Has following equipment at home: None  OCCUPATION: FT Research scientist (medical) in [place]  PLOF: Independent, Vocation/Vocational requirements: some overhead reaching with occasional heavy lifting ~20#, and Leisure: weight lifting, disc golf, playing with his kids  PATIENT GOALS "Restore function sufficiently to avoid surgery, alternatively prepare muscularly for surgery."  OBJECTIVE:   DIAGNOSTIC FINDINGS:  06/25/21 - R shoulder MRI: 1. Partial-thickness articular sided tear of the majority of the anterior greater than posterior supraspinatus tendon, involving up to approximately 40% of the transverse tendon thickness anteriorly.  2. Mild articular side of the infraspinatus tendinosis diffusely. 3. Mild degenerative changes of the acromioclavicular joint. 06/11/21 - R shoulder x-ray: Mild acromioclavicular osteoarthritis.  PATIENT SURVEYS:  FOTO Shoulder = 63, predicted = 74  COGNITION:  Overall  cognitive status: Within functional limits for tasks assessed     SENSATION: WFL Occasional radicular pain & nervy feeling but no numbness   POSTURE: Fwd rounded and protracted R>L shoulder   UPPER EXTREMITY ROM:   Active ROM Right 07/15/21 Left 07/15/21  Shoulder flexion 148 161  Shoulder extension 48 55  Shoulder abduction 170 180  Shoulder adduction    Shoulder internal rotation 76 88  Shoulder external rotation 79 90  Elbow flexion    Elbow extension    Wrist flexion    Wrist extension    Wrist ulnar deviation    Wrist radial deviation    Wrist pronation    Wrist supination    (Blank rows = not tested)  UPPER EXTREMITY MMT:  MMT Right 07/15/21 Left 07/15/21  Shoulder flexion 5- 5  Shoulder extension 5 5  Shoulder abduction 4+ 5  Shoulder adduction    Shoulder internal rotation 4+ 5  Shoulder external rotation 4+ 5  Middle trapezius    Lower trapezius    Elbow flexion    Elbow extension    Wrist flexion    Wrist extension    Wrist ulnar deviation    Wrist radial deviation    Wrist pronation    Wrist supination    Grip strength (lbs)    (Blank rows = not tested)  SHOULDER SPECIAL TESTS:  Impingement tests: Hawkins/Kennedy impingement test: positive   Rotator cuff assessment: Empty can test: positive  and Full can test: negative   JOINT MOBILITY TESTING:  WFL  PALPATION:  Increased muscle tension/taut bands with a few TPs in R deltoids, supra and infraspinatus   TODAY'S TREATMENT:   07/29/21 THERAPEUTIC EXERCISE: to improve flexibility and strength along with postural control.  Verbal and tactile cues throughout for technique. UBE - L3.0 x 6 min (3' each fwd and back) R UT & LS stretches 2 x 30 sec w/o and with gentle overpressure GTB B shoulder scap retraction + horiz ABD 10 x 3", hooklying on FR GTB B shoulder scap retraction + diagonal horiz ABD 10 x 3", hooklying on FR GTB B shoulder scap retraction + shoulder ER 10 x 3", hooklying on  FR Demonstrated alternative options for prone I's, T's Y's & W's in prone over Pball vs prone on floor or mat table, but not attempted today   MANUAL THERAPY: To promote normalized muscle tension, improved flexibility,  improved joint mobility, increased ROM, and reduced pain. Skilled palpation and monitoring of soft tissue during DN Trigger Point Dry-Needling  Treatment instructions: Expect mild to moderate muscle soreness. S/S of pneumothorax if dry needled over a lung field, and to seek immediate medical attention should they occur. Patient verbalized understanding of these instructions and education.  Patient Consent Given: Yes Education handout provided: No - Pt declined, reporting previous experience with DN Muscles treated: R pec major, UT and LS Electrical stimulation performed: No Parameters: N/A Treatment response/outcome: Twitch Response Elicited and Palpable Increase in Muscle Length STM/DTM and manual TPR to muscles addressed with DN   07/15/21 THERAPEUTIC EXERCISE: Instruction in initial HEP to improve flexibility and strength along with postural control.  Verbal and tactile cues throughout for technique.  PATIENT EDUCATION: Education details: PT eval findings, anticipated POC, and initial HEP Person educated: Patient Education method: Explanation, Demonstration, Verbal cues, and [place] code emailed to patient Education comprehension: verbalized understanding, returned demonstration, verbal cues required, and needs further education   HOME EXERCISE PROGRAM: Access Code: [email or web address] (emailed to patient)  Exercises - Doorway Pec Stretch at 60 Elevation  - 2-3 x daily - 7 x weekly - 3 reps - 30 sec hold - Doorway Pec Stretch at 90 Degrees Abduction  - 2-3 x daily - 7 x weekly - 3 reps - 30 sec hold - Doorway Pec Stretch at 120 Degrees Abduction  - 2-3 x daily - 7 x weekly - 3 reps - 30 sec hold - [place] on [employer]  - 2 x daily - 7 x weekly - 2 sets - 10 reps - 5 sec  hold - Prone Scapular Slide with Shoulder Extension  - 1 x daily - 5 x weekly - 2 sets - 10 reps - 3 sec hold - Prone Scapular Retraction Arms at Side  - 1 x daily - 5 x weekly - 2 sets - 10 reps - 3 sec hold - Prone Scapular Retraction Y  - 1 x daily - 5 x weekly - 2 sets - 10 reps - 3 sec hold - Prone W Scapular Retraction  - 1 x daily - 5 x weekly - 2 sets - 10 reps - 3 sec hold - Seated Cervical Sidebending Stretch  - 2-3 x daily - 7 x weekly - 3 reps - 30 sec hold - Seated Levator Scapulae Stretch  - 2-3 x daily - 7 x weekly - 3 reps - 30 sec hold - Supine Shoulder Horizontal Abduction with Resistance  - 1 x daily - 3 x weekly - 2 sets - 10 reps - 3 sec hold - Standing Shoulder Diagonal Horizontal Abduction 60/120 Degrees with Resistance  - 1 x daily - 3 x weekly - 2 sets - 10 reps - 3 sec hold - Supine Shoulder External Rotation on Foam Roll with Theraband  - 1 x daily - 3 x weekly - 2 sets - 10 reps - 3 sec hold  Patient Education - Trigger Point Dry Needling  ASSESSMENT:  CLINICAL IMPRESSION: Suezanne Jacquet" reports the orthopedic surgeon states no need for surgery at present but left it up to [facility] to decide if/when he feels that he needs it, encouraging him to try PT to improve pain and function. He reports he has primarily focused on the stretches thus far with the HEP noting better stretch with the doorway pec stretches vs the foam roller versions, and has noted tightness in his neck as he has performed the  stretches. Increased muscle tension/taut bands still evident in R pecs as well as R UT and LS which appeared amenable to DN. After explanation of DN rational, procedures, outcomes and potential side effects including precautions with DN over the lung fields, patient verbalized consent to DN treatment in conjunction with manual STM/DTM and TPR to reduce ttp/muscle tension. Muscles treated include R pec major, UT and LS. DN produced normal response with good twitches elicited resulting in  palpable reduction in pain/ttp and muscle tension. Pt educated to expect mild to moderate muscle soreness for up to 24-48 hrs and instructed to continue prescribed home exercise program and current activity level with pt verbalizing understanding of theses instructions. DN followed by instruction in stretching for UT and LS and well as scapular strengthening to reinforce reduced muscle tension and improved posture - HEP updated accordingly.  OBJECTIVE IMPAIRMENTS decreased activity tolerance, decreased knowledge of condition, decreased ROM, decreased strength, increased fascial restrictions, impaired perceived functional ability, increased muscle spasms, impaired flexibility, improper body mechanics, postural dysfunction, and pain.   ACTIVITY LIMITATIONS lifting and reach over head  PARTICIPATION LIMITATIONS: cleaning, community activity, occupation, yard work, and recreational activities  [place] Past/current experiences, Time since onset of injury/illness/exacerbation, and 3+ comorbidities: Paroxysmal SVT, GERD, lumbar DDD  are also affecting patient's functional outcome.   REHAB POTENTIAL: Good  CLINICAL DECISION MAKING: Stable/uncomplicated  EVALUATION COMPLEXITY: Low   GOALS: Goals reviewed with patient? Yes  SHORT TERM GOALS: Target date: 08/12/2021   Patient will be independent with initial HEP.  Baseline:  Goal status: IN PROGRESS   LONG TERM GOALS: Target date: 09/09/2021   Patient will be independent with advanced/ongoing HEP to improve outcomes and carryover.  Baseline:  Goal status: IN PROGRESS  2.  Patient will report 75% improvement in Right shoulder pain to improve QOL.  Baseline:  Goal status: IN PROGRESS  3.  Patient to demonstrate improved upright posture with posterior shoulder girdle engaged to promote improved glenohumeral joint mobility. Baseline:  Goal status: IN PROGRESS  4.  Patient to improve R shoulder AROM to WNL without pain provocation to  allow for increased ease of ADLs.  Baseline:  Goal status: IN PROGRESS  5.  Patient will demonstrate improved R shoulder strength to 5/5 for functional UE use. Baseline:  Goal status: IN PROGRESS  6  Patient will report 74 on shoulder FOTO to demonstrate improved functional ability.  Baseline: 63 Goal status: IN PROGRESS  7.  Patient to report ability to perform ADLs, household, and work-related tasks without limitation due to R shoulder pain, LOM or weakness. Baseline:  Goal status: IN PROGRESS    PLAN: PT FREQUENCY: 1x/week to biweekly  PT DURATION: 8 weeks  PLANNED INTERVENTIONS: Therapeutic exercises, Therapeutic activity, Neuromuscular re-education, Balance training, Gait training, Patient/Family education, Joint mobilization, Dry Needling, Electrical stimulation, Cryotherapy, Moist heat, Taping, Vasopneumatic device, Ultrasound, Ionotophoresis '4mg'$ /ml Dexamethasone, Manual therapy, and Re-evaluation  PLAN FOR NEXT SESSION: Review HEP as needed; progress scapular strengthening and introduce gentle RTC strengthening; MT +/- DN as indicated for abnormal muscle tension/tightness   Percival Spanish, PT 07/29/2021, 5:22 PM

## 2021-07-30 ENCOUNTER — Other Ambulatory Visit (HOSPITAL_BASED_OUTPATIENT_CLINIC_OR_DEPARTMENT_OTHER): Payer: Self-pay

## 2021-08-07 ENCOUNTER — Other Ambulatory Visit (HOSPITAL_BASED_OUTPATIENT_CLINIC_OR_DEPARTMENT_OTHER): Payer: Self-pay

## 2021-08-08 ENCOUNTER — Other Ambulatory Visit (HOSPITAL_BASED_OUTPATIENT_CLINIC_OR_DEPARTMENT_OTHER): Payer: Self-pay

## 2021-08-12 ENCOUNTER — Other Ambulatory Visit (HOSPITAL_BASED_OUTPATIENT_CLINIC_OR_DEPARTMENT_OTHER): Payer: Self-pay

## 2021-08-12 ENCOUNTER — Other Ambulatory Visit: Payer: Self-pay

## 2021-08-12 ENCOUNTER — Other Ambulatory Visit: Payer: Self-pay | Admitting: Sports Medicine

## 2021-08-12 DIAGNOSIS — E669 Obesity, unspecified: Secondary | ICD-10-CM

## 2021-08-12 MED ORDER — WEGOVY 0.5 MG/0.5ML ~~LOC~~ SOAJ
0.5000 mg | SUBCUTANEOUS | 1 refills | Status: DC
  Filled 2021-08-12: qty 2, 28d supply, fill #0
  Filled 2021-09-09: qty 2, 28d supply, fill #1

## 2021-08-12 MED ORDER — WEGOVY 0.25 MG/0.5ML ~~LOC~~ SOAJ
0.2500 mg | SUBCUTANEOUS | 0 refills | Status: DC
  Filled 2021-08-12: qty 2, 28d supply, fill #0

## 2021-08-14 ENCOUNTER — Other Ambulatory Visit (HOSPITAL_BASED_OUTPATIENT_CLINIC_OR_DEPARTMENT_OTHER): Payer: Self-pay

## 2021-08-19 ENCOUNTER — Encounter: Payer: Self-pay | Admitting: Physical Therapy

## 2021-08-19 ENCOUNTER — Ambulatory Visit: Payer: No Typology Code available for payment source | Attending: Sports Medicine | Admitting: Physical Therapy

## 2021-08-19 DIAGNOSIS — G8929 Other chronic pain: Secondary | ICD-10-CM

## 2021-08-19 DIAGNOSIS — R293 Abnormal posture: Secondary | ICD-10-CM

## 2021-08-19 DIAGNOSIS — M25511 Pain in right shoulder: Secondary | ICD-10-CM | POA: Diagnosis not present

## 2021-08-19 DIAGNOSIS — M6281 Muscle weakness (generalized): Secondary | ICD-10-CM | POA: Diagnosis present

## 2021-08-19 DIAGNOSIS — R29898 Other symptoms and signs involving the musculoskeletal system: Secondary | ICD-10-CM

## 2021-08-19 NOTE — Therapy (Addendum)
OUTPATIENT PHYSICAL THERAPY TREATMENT / DISCHARGE SUMMARY   Patient Name: Angel Marshall MRN: [id number] DOB:November 16, 1980, 41 y.o., male Today's Date: 08/19/2021   PT End of Session - 08/19/21 1615     Visit Number 3    Number of Visits 8    Date for PT Re-Evaluation 09/09/21    Authorization Type [facility]    PT Start Time 1615    PT Stop Time 1706    PT Time Calculation (min) 51 min    Activity Tolerance Patient tolerated treatment well    Behavior During Therapy WFL for tasks assessed/performed               Past Medical History:  Diagnosis Date   GERD (gastroesophageal reflux disease)    Past Surgical History:  Procedure Laterality Date   APPENDECTOMY  09/2015   LAPAROSCOPIC APPENDECTOMY N/A 09/25/2015   Procedure: APPENDECTOMY LAPAROSCOPIC;  Surgeon: Erroll Luna, MD;  Location: [facility];  Service: General;  Laterality: N/A;   UMBILECTOMY     UMBILICAL SURGERY     Patient Active Problem List   Diagnosis Date Noted   Fatigue 08/16/2020   Obesity (BMI 30-39.9) 08/16/2020   GERD (gastroesophageal reflux disease) 07/23/2020   Paroxysmal supraventricular tachycardia with syncope 07/04/2020   Hypogonadism in male 92/42/6834   Metabolic syndrome 19/62/2297   Onychomycosis 04/13/2017   Mixed hyperlipidemia 04/10/2017   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   Annual physical exam 03/02/2017   Cranial nerve dysfunction 03/02/2017   Chronic right shoulder pain 03/02/2017   Lumbar degenerative disc disease 03/02/2017   Dysplastic nevus 03/02/2017   History of appendectomy 09/25/2015   Left knee pain 08/08/2015    PCP: Silverio Decamp, MD  REFERRING PROVIDER: Silverio Decamp, MD  REFERRING DIAG: [id number] (ICD-10-CM) - Chronic right shoulder pain  THERAPY DIAG:  Chronic right shoulder pain  Abnormal posture  Muscle weakness (generalized)  Other symptoms and signs involving the musculoskeletal system  RATIONALE FOR EVALUATION AND  TREATMENT: Rehabilitation  ONSET DATE: >10 yrs  SUBJECTIVE:                                                                                                                                                                                      SUBJECTIVE STATEMENT: Pt reports he was really sore the next day following the DN but feels like it did what it was supposed to do. Now able to get good relief just with doorway stretch. Still notes a tight band in his posterior R shoulder with PVC AAROM overhead stretch. He reports he was able to do a fairly heavy upper body workout last night with intermittent stretching to  manage the tightness. He denies pain in the R shoulder today but still notes a difference between his L and R arm.  PAIN:  Are you having pain? No  PERTINENT HISTORY: Paroxysmal SVT, GERD, lumbar DDD  PRECAUTIONS: None  WEIGHT BEARING RESTRICTIONS No  FALLS:  Has patient fallen in last 6 months? No  LIVING ENVIRONMENT: Lives with: lives with their family and lives with their spouse Lives in: House/apartment Stairs: Yes Has following equipment at home: None  OCCUPATION: FT Research scientist (medical) in [place]  PLOF: Independent, Vocation/Vocational requirements: some overhead reaching with occasional heavy lifting ~20#, and Leisure: weight lifting, disc golf, playing with his kids  PATIENT GOALS "Restore function sufficiently to avoid surgery, alternatively prepare muscularly for surgery."  OBJECTIVE:   DIAGNOSTIC FINDINGS:  06/25/21 - R shoulder MRI: 1. Partial-thickness articular sided tear of the majority of the anterior greater than posterior supraspinatus tendon, involving up to approximately 40% of the transverse tendon thickness anteriorly.  2. Mild articular side of the infraspinatus tendinosis diffusely. 3. Mild degenerative changes of the acromioclavicular joint. 06/11/21 - R shoulder x-ray: Mild acromioclavicular osteoarthritis.  PATIENT SURVEYS:  FOTO Shoulder =  63, predicted = 74  COGNITION:  Overall cognitive status: Within functional limits for tasks assessed     SENSATION: WFL Occasional radicular pain & nervy feeling but no numbness   POSTURE: Fwd rounded and protracted R>L shoulder   UPPER EXTREMITY ROM:   Active ROM Right 07/15/21 Left 07/15/21 Right 08/19/21 Left  08/19/21  Shoulder flexion 148 161 168 170  Shoulder extension 48 55    Shoulder abduction 170 180 188 190  Shoulder adduction      Shoulder internal rotation 76 88 84   Shoulder external rotation 79 90 89   Elbow flexion      Elbow extension      Wrist flexion      Wrist extension      Wrist ulnar deviation      Wrist radial deviation      Wrist pronation      Wrist supination      (Blank rows = not tested)  UPPER EXTREMITY MMT:  MMT Right 07/15/21 Left 07/15/21  Shoulder flexion 5 5  Shoulder extension 5 5  Shoulder abduction 5- 5  Shoulder adduction    Shoulder internal rotation 5 5  Shoulder external rotation 5 5  Middle trapezius    Lower trapezius    Elbow flexion    Elbow extension    Wrist flexion    Wrist extension    Wrist ulnar deviation    Wrist radial deviation    Wrist pronation    Wrist supination    Grip strength (lbs)    (Blank rows = not tested)  SHOULDER SPECIAL TESTS:  Impingement tests: Hawkins/Kennedy impingement test: positive   Rotator cuff assessment: Empty can test: positive  and Full can test: negative   JOINT MOBILITY TESTING:  WFL  PALPATION:  Increased muscle tension/taut bands with a few TPs in R deltoids, supra and infraspinatus   TODAY'S TREATMENT:   08/19/21 THERAPEUTIC EXERCISE: to improve flexibility and strength along with postural control.  Verbal and tactile cues throughout for technique. UBE - L3.0 x 6 min (3' each fwd and back) - started at L4.0 but reduced to 3.0 d/t c/o muscle soreness from his workout yesterday  MANUAL THERAPY: To promote normalized muscle tension, improved flexibility, improved  joint mobility, increased ROM, and reduced pain. Skilled palpation and monitoring of  soft tissue during DN Trigger Point Dry-Needling  Treatment instructions: Expect mild to moderate muscle soreness. S/S of pneumothorax if dry needled over a lung field, and to seek immediate medical attention should they occur. Patient verbalized understanding of these instructions and education.  Patient Consent Given: Yes Education handout provided: No - Pt declined, reporting previous experience with DN Muscles treated: R UT, scalenes, LS, rhomboids, infraspinatus and subscapularis  Electrical stimulation performed: No Parameters: N/A Treatment response/outcome: Twitch Response Elicited and Palpable Increase in Muscle Length STM/DTM and manual TPR to muscles addressed with DN   07/29/21 THERAPEUTIC EXERCISE: to improve flexibility and strength along with postural control.  Verbal and tactile cues throughout for technique. UBE - L3.0 x 6 min (3' each fwd and back) R UT & LS stretches 2 x 30 sec w/o and with gentle overpressure GTB B shoulder scap retraction + horiz ABD 10 x 3", hooklying on FR GTB B shoulder scap retraction + diagonal horiz ABD 10 x 3", hooklying on FR GTB B shoulder scap retraction + shoulder ER 10 x 3", hooklying on FR Demonstrated alternative options for prone I's, T's Y's & W's in prone over Pball vs prone on floor or mat table, but not attempted today   MANUAL THERAPY: To promote normalized muscle tension, improved flexibility, improved joint mobility, increased ROM, and reduced pain. Skilled palpation and monitoring of soft tissue during DN Trigger Point Dry-Needling  Treatment instructions: Expect mild to moderate muscle soreness. S/S of pneumothorax if dry needled over a lung field, and to seek immediate medical attention should they occur. Patient verbalized understanding of these instructions and education.  Patient Consent Given: Yes Education handout provided: No - Pt  declined, reporting previous experience with DN Muscles treated: R pec major, UT and LS Electrical stimulation performed: No Parameters: N/A Treatment response/outcome: Twitch Response Elicited and Palpable Increase in Muscle Length STM/DTM and manual TPR to muscles addressed with DN   07/15/21 THERAPEUTIC EXERCISE: Instruction in initial HEP to improve flexibility and strength along with postural control.  Verbal and tactile cues throughout for technique.  PATIENT EDUCATION: Education details: PT eval findings, anticipated POC, and initial HEP Person educated: Patient Education method: Explanation, Demonstration, Verbal cues, and [place] code emailed to patient Education comprehension: verbalized understanding, returned demonstration, verbal cues required, and needs further education   HOME EXERCISE PROGRAM: Access Code: [email or web address] (emailed to patient)  Exercises - Doorway Pec Stretch at 60 Elevation  - 2-3 x daily - 7 x weekly - 3 reps - 30 sec hold - Doorway Pec Stretch at 90 Degrees Abduction  - 2-3 x daily - 7 x weekly - 3 reps - 30 sec hold - Doorway Pec Stretch at 120 Degrees Abduction  - 2-3 x daily - 7 x weekly - 3 reps - 30 sec hold - [place] on [employer]  - 2 x daily - 7 x weekly - 2 sets - 10 reps - 5 sec hold - Prone Scapular Slide with Shoulder Extension  - 1 x daily - 5 x weekly - 2 sets - 10 reps - 3 sec hold - Prone Scapular Retraction Arms at Side  - 1 x daily - 5 x weekly - 2 sets - 10 reps - 3 sec hold - Prone Scapular Retraction Y  - 1 x daily - 5 x weekly - 2 sets - 10 reps - 3 sec hold - Prone W Scapular Retraction  - 1 x daily - 5 x weekly - 2 sets -  10 reps - 3 sec hold - Seated Cervical Sidebending Stretch  - 2-3 x daily - 7 x weekly - 3 reps - 30 sec hold - Seated Levator Scapulae Stretch  - 2-3 x daily - 7 x weekly - 3 reps - 30 sec hold - Supine Shoulder Horizontal Abduction with Resistance  - 1 x daily - 3 x weekly - 2 sets - 10 reps - 3 sec hold -  Standing Shoulder Diagonal Horizontal Abduction 60/120 Degrees with Resistance  - 1 x daily - 3 x weekly - 2 sets - 10 reps - 3 sec hold - Supine Shoulder External Rotation on Foam Roll with Theraband  - 1 x daily - 3 x weekly - 2 sets - 10 reps - 3 sec hold  Patient Education - Trigger Point Dry Needling  ASSESSMENT:  CLINICAL IMPRESSION: Suezanne Jacquet" reports 90% improvement in his R shoulder pain with restoration of full normal AROM and 5/5 strength. He reports he has been able to perform all activities that would have previously been triggering for his pain w/o recent pain noted. He completed a relatively heavy upper body workout yesterday and other than expected post-workout muscle soreness, has not pain today. He notes ability to manage his pain with good postural awareness and pec stretching. Today's treatment targeting remaining areas of abnormal muscle tension with MT incorporating DN - good twitch responses elicited with pt unable to reproduce the catching sensation he had been experiencing with wand assisted ROM/stretching following MT and DN. All goals met and pt would like to try transitioning to his HEP but will remain on hold for 30-days in the event that further issues arise with his shoulder.  OBJECTIVE IMPAIRMENTS decreased activity tolerance, decreased knowledge of condition, decreased ROM, decreased strength, increased fascial restrictions, impaired perceived functional ability, increased muscle spasms, impaired flexibility, improper body mechanics, postural dysfunction, and pain.   ACTIVITY LIMITATIONS lifting and reach over head  PARTICIPATION LIMITATIONS: cleaning, community activity, occupation, yard work, and recreational activities  [place] Past/current experiences, Time since onset of injury/illness/exacerbation, and 3+ comorbidities: Paroxysmal SVT, GERD, lumbar DDD  are also affecting patient's functional outcome.   REHAB POTENTIAL: Good  CLINICAL DECISION  MAKING: Stable/uncomplicated  EVALUATION COMPLEXITY: Low   GOALS: Goals reviewed with patient? Yes  SHORT TERM GOALS: Target date: 08/12/2021   Patient will be independent with initial HEP.  Baseline:  Goal status: MET  08/19/21   LONG TERM GOALS: Target date: 09/09/2021   Patient will be independent with advanced/ongoing HEP to improve outcomes and carryover.  Baseline:  Goal status: MET  08/19/21  2.  Patient will report 75% improvement in Right shoulder pain to improve QOL.  Baseline:  Goal status: MET  08/19/21 - 90% improvement in pain reported  3.  Patient to demonstrate improved upright posture with posterior shoulder girdle engaged to promote improved glenohumeral joint mobility. Baseline:  Goal status: MET  08/19/21  4.  Patient to improve R shoulder AROM to WNL without pain provocation to allow for increased ease of ADLs.  Baseline:  Goal status: MET  08/19/21  5.  Patient will demonstrate improved R shoulder strength to 5/5 for functional UE use. Baseline:  Goal status: MET  08/19/21  6  Patient will report 74 on shoulder FOTO to demonstrate improved functional ability.  Baseline: 63 Goal status: MET  08/19/21 - Shoulder FOTO = 80  7.  Patient to report ability to perform ADLs, household, and work-related tasks without limitation due to  R shoulder pain, LOM or weakness. Baseline:  Goal status: MET  08/19/21   PLAN: PT FREQUENCY: 1x/week to biweekly  PT DURATION: 8 weeks  PLANNED INTERVENTIONS: Therapeutic exercises, Therapeutic activity, Neuromuscular re-education, Balance training, Gait training, Patient/Family education, Joint mobilization, Dry Needling, Electrical stimulation, Cryotherapy, Moist heat, Taping, Vasopneumatic device, Ultrasound, Ionotophoresis 18m/ml Dexamethasone, Manual therapy, and Re-evaluation  PLAN FOR NEXT SESSION: transition to HEP + 30-day hold  JPercival Spanish PT 08/19/2021, 5:34 PM    PHYSICAL THERAPY DISCHARGE SUMMARY  Visits  from Start of Care: 3  Current functional level related to goals / functional outcomes:   Refer to above clinical impression and goal assessment for status as of last visit on 08/19/2021. Patient was placed on hold for 30 days and did not return to PT, therefore will proceed with discharge from PT for this episode.    Remaining deficits:   Patient contacted PT on day 30 of the hold window about possibly returning to PT in the next few weeks for unspecified concern - he is aware that a new referral will be necessary as it would be >30 days from last visit.   Education / Equipment:   HEP   Patient agrees to discharge. Patient goals were met. Patient is being discharged due to meeting the stated rehab goals.  JPercival Spanish PT, MPT 10/04/21, 8:55 AM  [facility] [place] [address]Phone: [phone]  Fax:  [phone]

## 2021-08-21 ENCOUNTER — Other Ambulatory Visit (HOSPITAL_BASED_OUTPATIENT_CLINIC_OR_DEPARTMENT_OTHER): Payer: Self-pay

## 2021-08-30 ENCOUNTER — Other Ambulatory Visit (HOSPITAL_BASED_OUTPATIENT_CLINIC_OR_DEPARTMENT_OTHER): Payer: Self-pay

## 2021-09-09 ENCOUNTER — Other Ambulatory Visit (HOSPITAL_BASED_OUTPATIENT_CLINIC_OR_DEPARTMENT_OTHER): Payer: Self-pay

## 2021-09-13 ENCOUNTER — Other Ambulatory Visit: Payer: Self-pay | Admitting: Sports Medicine

## 2021-09-13 ENCOUNTER — Other Ambulatory Visit (HOSPITAL_BASED_OUTPATIENT_CLINIC_OR_DEPARTMENT_OTHER): Payer: Self-pay

## 2021-09-17 ENCOUNTER — Other Ambulatory Visit: Payer: Self-pay | Admitting: Sports Medicine

## 2021-09-17 ENCOUNTER — Other Ambulatory Visit (HOSPITAL_BASED_OUTPATIENT_CLINIC_OR_DEPARTMENT_OTHER): Payer: Self-pay

## 2021-09-17 DIAGNOSIS — E669 Obesity, unspecified: Secondary | ICD-10-CM

## 2021-09-17 MED ORDER — WEGOVY 1 MG/0.5ML ~~LOC~~ SOAJ
1.0000 mg | SUBCUTANEOUS | 0 refills | Status: DC
  Filled 2021-09-17: qty 2, 28d supply, fill #0

## 2021-09-18 ENCOUNTER — Other Ambulatory Visit (HOSPITAL_BASED_OUTPATIENT_CLINIC_OR_DEPARTMENT_OTHER): Payer: Self-pay

## 2021-09-24 ENCOUNTER — Other Ambulatory Visit (HOSPITAL_BASED_OUTPATIENT_CLINIC_OR_DEPARTMENT_OTHER): Payer: Self-pay

## 2021-10-18 ENCOUNTER — Other Ambulatory Visit (HOSPITAL_BASED_OUTPATIENT_CLINIC_OR_DEPARTMENT_OTHER): Payer: Self-pay

## 2021-10-18 ENCOUNTER — Other Ambulatory Visit: Payer: Self-pay | Admitting: Sports Medicine

## 2021-10-18 MED ORDER — WEGOVY 0.5 MG/0.5ML ~~LOC~~ SOAJ
0.5000 mg | SUBCUTANEOUS | 11 refills | Status: DC
Start: 2021-10-18 — End: 2021-12-18
  Filled 2021-10-18: qty 2, 28d supply, fill #0
  Filled 2021-11-08 – 2021-11-11 (×2): qty 2, 28d supply, fill #1

## 2021-10-30 ENCOUNTER — Other Ambulatory Visit (HOSPITAL_BASED_OUTPATIENT_CLINIC_OR_DEPARTMENT_OTHER): Payer: Self-pay

## 2021-11-08 ENCOUNTER — Other Ambulatory Visit (HOSPITAL_BASED_OUTPATIENT_CLINIC_OR_DEPARTMENT_OTHER): Payer: Self-pay

## 2021-11-11 ENCOUNTER — Other Ambulatory Visit (HOSPITAL_BASED_OUTPATIENT_CLINIC_OR_DEPARTMENT_OTHER): Payer: Self-pay

## 2021-11-18 ENCOUNTER — Other Ambulatory Visit (HOSPITAL_BASED_OUTPATIENT_CLINIC_OR_DEPARTMENT_OTHER): Payer: Self-pay

## 2021-11-19 ENCOUNTER — Other Ambulatory Visit (HOSPITAL_BASED_OUTPATIENT_CLINIC_OR_DEPARTMENT_OTHER): Payer: Self-pay

## 2021-11-22 ENCOUNTER — Other Ambulatory Visit (HOSPITAL_BASED_OUTPATIENT_CLINIC_OR_DEPARTMENT_OTHER): Payer: Self-pay

## 2021-11-22 MED ORDER — FLUARIX QUADRIVALENT 0.5 ML IM SUSY
PREFILLED_SYRINGE | INTRAMUSCULAR | 0 refills | Status: DC
Start: 2021-11-22 — End: 2022-09-02
  Filled 2021-11-22: qty 0.5, 1d supply, fill #0

## 2021-11-25 ENCOUNTER — Other Ambulatory Visit (HOSPITAL_BASED_OUTPATIENT_CLINIC_OR_DEPARTMENT_OTHER): Payer: Self-pay

## 2021-11-26 ENCOUNTER — Other Ambulatory Visit: Payer: Self-pay

## 2021-11-26 ENCOUNTER — Other Ambulatory Visit (HOSPITAL_BASED_OUTPATIENT_CLINIC_OR_DEPARTMENT_OTHER): Payer: Self-pay

## 2021-12-13 ENCOUNTER — Other Ambulatory Visit (HOSPITAL_BASED_OUTPATIENT_CLINIC_OR_DEPARTMENT_OTHER): Payer: Self-pay

## 2021-12-16 ENCOUNTER — Other Ambulatory Visit (HOSPITAL_BASED_OUTPATIENT_CLINIC_OR_DEPARTMENT_OTHER): Payer: Self-pay

## 2021-12-16 ENCOUNTER — Other Ambulatory Visit: Payer: Self-pay | Admitting: Sports Medicine

## 2021-12-17 ENCOUNTER — Encounter: Payer: Self-pay | Admitting: Sports Medicine

## 2021-12-17 ENCOUNTER — Other Ambulatory Visit (HOSPITAL_BASED_OUTPATIENT_CLINIC_OR_DEPARTMENT_OTHER): Payer: Self-pay

## 2021-12-17 DIAGNOSIS — E669 Obesity, unspecified: Secondary | ICD-10-CM

## 2021-12-18 ENCOUNTER — Other Ambulatory Visit: Payer: Self-pay | Admitting: Sports Medicine

## 2021-12-18 ENCOUNTER — Other Ambulatory Visit (HOSPITAL_BASED_OUTPATIENT_CLINIC_OR_DEPARTMENT_OTHER): Payer: Self-pay

## 2021-12-18 MED ORDER — WEGOVY 1 MG/0.5ML ~~LOC~~ SOAJ
1.0000 mg | SUBCUTANEOUS | 3 refills | Status: DC
  Filled 2021-12-18: qty 2, 28d supply, fill #0

## 2021-12-25 ENCOUNTER — Other Ambulatory Visit (HOSPITAL_BASED_OUTPATIENT_CLINIC_OR_DEPARTMENT_OTHER): Payer: Self-pay

## 2022-02-03 ENCOUNTER — Encounter (INDEPENDENT_AMBULATORY_CARE_PROVIDER_SITE_OTHER): Payer: No Typology Code available for payment source | Admitting: Sports Medicine

## 2022-02-03 DIAGNOSIS — E669 Obesity, unspecified: Secondary | ICD-10-CM | POA: Diagnosis not present

## 2022-02-04 ENCOUNTER — Other Ambulatory Visit (HOSPITAL_BASED_OUTPATIENT_CLINIC_OR_DEPARTMENT_OTHER): Payer: Self-pay

## 2022-02-04 MED ORDER — ZEPBOUND 2.5 MG/0.5ML ~~LOC~~ SOAJ
2.5000 mg | SUBCUTANEOUS | 11 refills | Status: DC
  Filled 2022-02-04 – 2022-02-06 (×2): qty 2, 28d supply, fill #0
  Filled 2022-03-11: qty 2, 28d supply, fill #1

## 2022-02-04 NOTE — Telephone Encounter (Signed)
I spent 5 total minutes of online digital evaluation and management services in this patient-initiated request for online care. 

## 2022-02-06 ENCOUNTER — Other Ambulatory Visit: Payer: Self-pay

## 2022-02-06 ENCOUNTER — Other Ambulatory Visit (HOSPITAL_BASED_OUTPATIENT_CLINIC_OR_DEPARTMENT_OTHER): Payer: Self-pay

## 2022-02-06 ENCOUNTER — Other Ambulatory Visit: Payer: Self-pay | Admitting: Sports Medicine

## 2022-02-06 MED ORDER — OSELTAMIVIR PHOSPHATE 75 MG PO CAPS
75.0000 mg | ORAL_CAPSULE | Freq: Two times a day (BID) | ORAL | 0 refills | Status: DC
  Filled 2022-02-06: qty 10, 5d supply, fill #0

## 2022-02-07 ENCOUNTER — Telehealth: Payer: Self-pay

## 2022-02-07 ENCOUNTER — Other Ambulatory Visit (HOSPITAL_BASED_OUTPATIENT_CLINIC_OR_DEPARTMENT_OTHER): Payer: Self-pay

## 2022-02-07 ENCOUNTER — Other Ambulatory Visit: Payer: Self-pay

## 2022-02-07 NOTE — Telephone Encounter (Addendum)
Initiated Prior authorization MAU:[id number] 2.'5MG'$ /0.5ML pen-injectors Via: Covermymeds Case/Key:[id number] Status: approved as of 02/07/22 Reason:The request has been approved. The authorization is effective from 02/24/2022 to 08/23/2022, as long as the member is enrolled in their current health plan. The request was approved as submitted. This request is approved for a quantity of 85m per 28 days.Additional authorizations, eligible from 02/24/2022 through 08/23/2022, have been entered for the following:Zepbound '5mg'$ /0.559mpens, allowing a quantity of 12m45mer 28 days, please reference authorization [id number].Zepbound 7.'5mg'$ /0.5mL29mns, allowing a quantity of 12mL 5m 28 days, please reference authorization [id number] 800 6428ound '10mg'$ /0.5mL p78m, allowing a quantity of 12mL pe27m8 days, please reference authorization [id number].Zepbound 12.'5mg'$ /0.5mL pen712mallowing a quantity of 12mL per 15mdays, please reference authorization [id number].Zepbound '15mg'$ /0.5mL pens,35mlowing a quantity of 12mL per 2841mys, please reference authorization [id number]. A written notification letter will follow with additional details. Notified Pt via: Mychart

## 2022-02-11 ENCOUNTER — Other Ambulatory Visit (HOSPITAL_BASED_OUTPATIENT_CLINIC_OR_DEPARTMENT_OTHER): Payer: Self-pay

## 2022-02-11 ENCOUNTER — Other Ambulatory Visit: Payer: Self-pay

## 2022-02-12 ENCOUNTER — Other Ambulatory Visit (HOSPITAL_BASED_OUTPATIENT_CLINIC_OR_DEPARTMENT_OTHER): Payer: Self-pay

## 2022-02-13 ENCOUNTER — Other Ambulatory Visit (HOSPITAL_BASED_OUTPATIENT_CLINIC_OR_DEPARTMENT_OTHER): Payer: Self-pay

## 2022-02-14 ENCOUNTER — Other Ambulatory Visit (HOSPITAL_BASED_OUTPATIENT_CLINIC_OR_DEPARTMENT_OTHER): Payer: Self-pay

## 2022-02-17 ENCOUNTER — Other Ambulatory Visit: Payer: Self-pay

## 2022-02-17 ENCOUNTER — Other Ambulatory Visit (HOSPITAL_BASED_OUTPATIENT_CLINIC_OR_DEPARTMENT_OTHER): Payer: Self-pay

## 2022-02-18 ENCOUNTER — Other Ambulatory Visit (HOSPITAL_BASED_OUTPATIENT_CLINIC_OR_DEPARTMENT_OTHER): Payer: Self-pay

## 2022-02-19 ENCOUNTER — Other Ambulatory Visit (HOSPITAL_BASED_OUTPATIENT_CLINIC_OR_DEPARTMENT_OTHER): Payer: Self-pay

## 2022-02-20 ENCOUNTER — Other Ambulatory Visit (HOSPITAL_BASED_OUTPATIENT_CLINIC_OR_DEPARTMENT_OTHER): Payer: Self-pay

## 2022-02-21 ENCOUNTER — Encounter: Payer: Self-pay | Admitting: Sports Medicine

## 2022-02-24 ENCOUNTER — Other Ambulatory Visit (HOSPITAL_BASED_OUTPATIENT_CLINIC_OR_DEPARTMENT_OTHER): Payer: Self-pay

## 2022-02-25 ENCOUNTER — Other Ambulatory Visit (HOSPITAL_BASED_OUTPATIENT_CLINIC_OR_DEPARTMENT_OTHER): Payer: Self-pay

## 2022-03-07 ENCOUNTER — Other Ambulatory Visit: Payer: Self-pay

## 2022-03-11 ENCOUNTER — Other Ambulatory Visit: Payer: Self-pay | Admitting: Sports Medicine

## 2022-03-11 ENCOUNTER — Other Ambulatory Visit (HOSPITAL_COMMUNITY): Payer: Self-pay

## 2022-03-11 ENCOUNTER — Other Ambulatory Visit: Payer: Self-pay

## 2022-03-12 ENCOUNTER — Other Ambulatory Visit: Payer: Self-pay | Admitting: Sports Medicine

## 2022-03-12 ENCOUNTER — Other Ambulatory Visit (HOSPITAL_COMMUNITY): Payer: Self-pay

## 2022-03-12 ENCOUNTER — Other Ambulatory Visit (HOSPITAL_BASED_OUTPATIENT_CLINIC_OR_DEPARTMENT_OTHER): Payer: Self-pay

## 2022-03-12 MED ORDER — OMEPRAZOLE 10 MG PO CPDR
40.0000 mg | DELAYED_RELEASE_CAPSULE | Freq: Every day | ORAL | 3 refills | Status: DC
  Filled 2022-03-12: qty 40, 10d supply, fill #0
  Filled 2022-03-12: qty 320, 80d supply, fill #0
  Filled 2022-03-12: qty 40, 10d supply, fill #0
  Filled 2022-03-12: qty 360, 90d supply, fill #0
  Filled 2023-02-13: qty 120, 30d supply, fill #1

## 2022-03-13 ENCOUNTER — Other Ambulatory Visit (HOSPITAL_BASED_OUTPATIENT_CLINIC_OR_DEPARTMENT_OTHER): Payer: Self-pay

## 2022-03-14 ENCOUNTER — Other Ambulatory Visit (HOSPITAL_BASED_OUTPATIENT_CLINIC_OR_DEPARTMENT_OTHER): Payer: Self-pay

## 2022-03-17 ENCOUNTER — Other Ambulatory Visit (HOSPITAL_BASED_OUTPATIENT_CLINIC_OR_DEPARTMENT_OTHER): Payer: Self-pay

## 2022-03-17 ENCOUNTER — Other Ambulatory Visit: Payer: Self-pay

## 2022-03-17 ENCOUNTER — Other Ambulatory Visit: Payer: Self-pay | Admitting: Sports Medicine

## 2022-03-17 DIAGNOSIS — E782 Mixed hyperlipidemia: Secondary | ICD-10-CM

## 2022-03-20 ENCOUNTER — Other Ambulatory Visit (HOSPITAL_COMMUNITY): Payer: Self-pay

## 2022-03-20 ENCOUNTER — Other Ambulatory Visit (HOSPITAL_BASED_OUTPATIENT_CLINIC_OR_DEPARTMENT_OTHER): Payer: Self-pay

## 2022-03-20 ENCOUNTER — Other Ambulatory Visit: Payer: Self-pay

## 2022-03-20 ENCOUNTER — Other Ambulatory Visit: Payer: Self-pay | Admitting: Sports Medicine

## 2022-03-20 DIAGNOSIS — E782 Mixed hyperlipidemia: Secondary | ICD-10-CM

## 2022-03-20 MED ORDER — ROSUVASTATIN CALCIUM 10 MG PO TABS
10.0000 mg | ORAL_TABLET | Freq: Every day | ORAL | 3 refills | Status: DC
  Filled 2022-03-20 (×2): qty 90, 90d supply, fill #0
  Filled 2022-06-18: qty 90, 90d supply, fill #1
  Filled 2022-09-23 – 2022-09-30 (×3): qty 90, 90d supply, fill #2
  Filled 2022-12-29: qty 90, 90d supply, fill #3

## 2022-03-20 MED ORDER — FAMOTIDINE 20 MG PO TABS
40.0000 mg | ORAL_TABLET | Freq: Two times a day (BID) | ORAL | 3 refills | Status: DC
  Filled 2022-03-20 (×2): qty 360, 90d supply, fill #0

## 2022-03-21 ENCOUNTER — Other Ambulatory Visit: Payer: Self-pay

## 2022-04-02 ENCOUNTER — Encounter: Payer: Self-pay | Admitting: Sports Medicine

## 2022-04-02 ENCOUNTER — Other Ambulatory Visit (HOSPITAL_BASED_OUTPATIENT_CLINIC_OR_DEPARTMENT_OTHER): Payer: Self-pay

## 2022-04-02 ENCOUNTER — Other Ambulatory Visit (HOSPITAL_COMMUNITY): Payer: Self-pay

## 2022-04-02 DIAGNOSIS — E669 Obesity, unspecified: Secondary | ICD-10-CM

## 2022-04-02 MED ORDER — ZEPBOUND 5 MG/0.5ML ~~LOC~~ SOAJ
5.0000 mg | SUBCUTANEOUS | 3 refills | Status: DC
  Filled 2022-04-02: qty 6, 84d supply, fill #0

## 2022-04-08 ENCOUNTER — Other Ambulatory Visit (HOSPITAL_BASED_OUTPATIENT_CLINIC_OR_DEPARTMENT_OTHER): Payer: Self-pay

## 2022-04-08 ENCOUNTER — Other Ambulatory Visit: Payer: Self-pay

## 2022-04-09 ENCOUNTER — Other Ambulatory Visit: Payer: Self-pay | Admitting: Sports Medicine

## 2022-04-09 ENCOUNTER — Other Ambulatory Visit (HOSPITAL_BASED_OUTPATIENT_CLINIC_OR_DEPARTMENT_OTHER): Payer: Self-pay

## 2022-04-09 ENCOUNTER — Encounter: Payer: Self-pay | Admitting: Sports Medicine

## 2022-04-09 DIAGNOSIS — E669 Obesity, unspecified: Secondary | ICD-10-CM

## 2022-04-10 ENCOUNTER — Other Ambulatory Visit (HOSPITAL_BASED_OUTPATIENT_CLINIC_OR_DEPARTMENT_OTHER): Payer: Self-pay

## 2022-04-10 MED ORDER — ZEPBOUND 2.5 MG/0.5ML ~~LOC~~ SOAJ
2.5000 mg | SUBCUTANEOUS | 3 refills | Status: DC
  Filled 2022-04-10: qty 6, 84d supply, fill #0
  Filled 2022-06-26: qty 6, 84d supply, fill #1

## 2022-04-11 ENCOUNTER — Other Ambulatory Visit (HOSPITAL_BASED_OUTPATIENT_CLINIC_OR_DEPARTMENT_OTHER): Payer: Self-pay

## 2022-04-18 ENCOUNTER — Other Ambulatory Visit (HOSPITAL_BASED_OUTPATIENT_CLINIC_OR_DEPARTMENT_OTHER): Payer: Self-pay

## 2022-04-21 ENCOUNTER — Other Ambulatory Visit (HOSPITAL_COMMUNITY): Payer: Self-pay

## 2022-04-28 ENCOUNTER — Other Ambulatory Visit (HOSPITAL_COMMUNITY): Payer: Self-pay

## 2022-06-17 ENCOUNTER — Other Ambulatory Visit (HOSPITAL_BASED_OUTPATIENT_CLINIC_OR_DEPARTMENT_OTHER): Payer: Self-pay

## 2022-06-18 ENCOUNTER — Other Ambulatory Visit: Payer: Self-pay

## 2022-06-26 ENCOUNTER — Other Ambulatory Visit: Payer: Self-pay

## 2022-07-11 ENCOUNTER — Other Ambulatory Visit: Payer: Self-pay

## 2022-07-19 ENCOUNTER — Other Ambulatory Visit (HOSPITAL_COMMUNITY): Payer: Self-pay

## 2022-08-04 ENCOUNTER — Other Ambulatory Visit (HOSPITAL_COMMUNITY): Payer: Self-pay

## 2022-08-06 ENCOUNTER — Other Ambulatory Visit: Payer: Self-pay

## 2022-08-08 ENCOUNTER — Other Ambulatory Visit (HOSPITAL_BASED_OUTPATIENT_CLINIC_OR_DEPARTMENT_OTHER): Payer: Self-pay

## 2022-08-14 ENCOUNTER — Other Ambulatory Visit (HOSPITAL_BASED_OUTPATIENT_CLINIC_OR_DEPARTMENT_OTHER): Payer: Self-pay

## 2022-09-01 ENCOUNTER — Other Ambulatory Visit (HOSPITAL_COMMUNITY): Payer: Self-pay

## 2022-09-02 ENCOUNTER — Encounter: Payer: Self-pay | Admitting: Sports Medicine

## 2022-09-02 ENCOUNTER — Ambulatory Visit (INDEPENDENT_AMBULATORY_CARE_PROVIDER_SITE_OTHER): Payer: 59 | Admitting: Sports Medicine

## 2022-09-02 VITALS — BP 113/71 | HR 88 | Ht 70.5 in | Wt 237.0 lb

## 2022-09-02 DIAGNOSIS — B351 Tinea unguium: Secondary | ICD-10-CM

## 2022-09-02 DIAGNOSIS — E291 Testicular hypofunction: Secondary | ICD-10-CM

## 2022-09-02 DIAGNOSIS — E8881 Metabolic syndrome: Secondary | ICD-10-CM

## 2022-09-02 DIAGNOSIS — M25511 Pain in right shoulder: Secondary | ICD-10-CM | POA: Diagnosis not present

## 2022-09-02 DIAGNOSIS — Z0000 Encounter for general adult medical examination without abnormal findings: Secondary | ICD-10-CM | POA: Diagnosis not present

## 2022-09-02 DIAGNOSIS — E669 Obesity, unspecified: Secondary | ICD-10-CM

## 2022-09-02 DIAGNOSIS — G8929 Other chronic pain: Secondary | ICD-10-CM | POA: Diagnosis not present

## 2022-09-02 NOTE — Assessment & Plan Note (Signed)
Annual physical as above, up-to-date on screenings, checking routine labs, return in a year.

## 2022-09-02 NOTE — Progress Notes (Signed)
Subjective:    CC: Annual Physical Exam  HPI:  This patient is here for their annual physical  I reviewed the past medical history, family history, social history, surgical history, and allergies today and no changes were needed.  Please see the problem list section below in epic for further details.  Past Medical History: Past Medical History:  Diagnosis Date   GERD (gastroesophageal reflux disease)    Past Surgical History: Past Surgical History:  Procedure Laterality Date   APPENDECTOMY  09/2015   LAPAROSCOPIC APPENDECTOMY N/A 09/25/2015   Procedure: APPENDECTOMY LAPAROSCOPIC;  Surgeon: Harriette Bouillon, MD;  Location: [facility];  Service: General;  Laterality: N/A;   UMBILECTOMY     UMBILICAL SURGERY     Social History: Social History   Socioeconomic History   Marital status: Married    Spouse name: Jodi Mourning   Number of children: 3   Years of education: Not on file   Highest education level: Doctorate  Occupational History   Occupation: Printmaker: [employer]  Tobacco Use   Smoking status: Never   Smokeless tobacco: Never  Substance and Sexual Activity   Alcohol use: No    Alcohol/week: 0.0 standard drinks of alcohol   Drug use: No   Sexual activity: Not on file  Other Topics Concern   Not on file  Social History Narrative   Not on file   Social Determinants of Health   Financial Resource Strain: Not on file  Food Insecurity: Not on file  Transportation Needs: Not on file  Physical Activity: Not on file  Stress: Not on file  Social Connections: Not on file   Family History: Family History  Problem Relation Age of Onset   Hyperlipidemia Mother    Cancer Mother        Breast   Hyperlipidemia Father    Diabetes Neg Hx    Heart attack Neg Hx    Hypertension Neg Hx    Sudden death Neg Hx    Allergies: No Known Allergies Medications: See med rec.  Review of Systems: No headache, visual changes, nausea, vomiting, diarrhea, constipation,  dizziness, abdominal pain, skin rash, fevers, chills, night sweats, swollen lymph nodes, weight loss, chest pain, body aches, joint swelling, muscle aches, shortness of breath, mood changes, visual or auditory hallucinations.  Objective:    General: Well Developed, well nourished, and in no acute distress.  Neuro: Alert and oriented x3, extra-ocular muscles intact, sensation grossly intact. Cranial nerves II through XII are intact, motor, sensory, and coordinative functions are all intact. HEENT: Normocephalic, atraumatic, pupils equal round reactive to light, neck supple, no masses, no lymphadenopathy, thyroid nonpalpable. Oropharynx, nasopharynx, external ear canals are unremarkable. Skin: Warm and dry, no rashes noted.  Cardiac: Regular rate and rhythm, no murmurs rubs or gallops.  Respiratory: Clear to auscultation bilaterally. Not using accessory muscles, speaking in full sentences.  Abdominal: Soft, nontender, nondistended, positive bowel sounds, no masses, no organomegaly.  Musculoskeletal: Shoulder, elbow, wrist, hip, knee, ankle stable, and with full range of motion.  Impression and Recommendations:    The patient was counselled, risk factors were discussed, anticipatory guidance given.  Annual physical exam Annual physical as above, up-to-date on screenings, checking routine labs, return in a year.  Chronic right shoulder pain Extensive rotator cuff tearing noted on MRI last year, he did consult with Dr. Everardo Pacific who suggested waiting before operative intervention, he had his wife do some manipulation and this has improved his pain, I suspect  she broke up some adhesions, he is happy with how things are going and no further intervention needed.  Obesity (BMI 30-39.9) Has gained some weight, no longer able to get Mounjaro through [facility], we can get him the compounded tirzepatide, he will let us know when he is ready.  ____________________________________________ Ihor Austin. Kentrail Stain, M.D., ABFM., CAQSM., AME. Primary Care and Sports Medicine [facility]  Adjunct Professor of Family Medicine  [place] [employer]  Restaurant manager, fast food

## 2022-09-02 NOTE — Assessment & Plan Note (Signed)
Has gained some weight, no longer able to get Mounjaro through [facility], we can get him the compounded tirzepatide, he will let us know when he is ready.

## 2022-09-02 NOTE — Assessment & Plan Note (Signed)
At this point been has failed 2 years of Lamisil, over 48 weeks of ciclopirox, Jubl[place], toes still look thickened and dystrophic, I would like Dr. Ralene Cork to weigh in.

## 2022-09-02 NOTE — Assessment & Plan Note (Signed)
Extensive rotator cuff tearing noted on MRI last year, he did consult with Dr. Everardo Pacific who suggested waiting before operative intervention, he had his wife do some manipulation and this has improved his pain, I suspect she broke up some adhesions, he is happy with how things are going and no further intervention needed.

## 2022-09-23 ENCOUNTER — Other Ambulatory Visit: Payer: Self-pay

## 2022-09-24 ENCOUNTER — Other Ambulatory Visit (HOSPITAL_BASED_OUTPATIENT_CLINIC_OR_DEPARTMENT_OTHER): Payer: Self-pay

## 2022-09-24 DIAGNOSIS — E291 Testicular hypofunction: Secondary | ICD-10-CM | POA: Diagnosis not present

## 2022-09-24 DIAGNOSIS — E8881 Metabolic syndrome: Secondary | ICD-10-CM | POA: Diagnosis not present

## 2022-09-29 LAB — CBC
Hematocrit: 50.5 % (ref 37.5–51.0)
Hemoglobin: 16.7 g/dL (ref 13.0–17.7)
MCH: 28.9 pg (ref 26.6–33.0)
MCHC: 33.1 g/dL (ref 31.5–35.7)
MCV: 87 fL (ref 79–97)
Platelets: 199 x10E3/uL (ref 150–450)
RBC: 5.78 x10E6/uL (ref 4.14–5.80)
RDW: 11.9 % (ref 11.6–15.4)
WBC: 6.2 x10E3/uL (ref 3.4–10.8)

## 2022-09-29 LAB — COMPREHENSIVE METABOLIC PANEL WITH GFR
ALT: 32 IU/L (ref 0–44)
AST: 34 IU/L (ref 0–40)
Albumin: 4.6 g/dL (ref 4.1–5.1)
Alkaline Phosphatase: 85 IU/L (ref 44–121)
BUN/Creatinine Ratio: 14 (ref 9–20)
BUN: 18 mg/dL (ref 6–24)
Bilirubin Total: 0.8 mg/dL (ref 0.0–1.2)
CO2: 25 mmol/L (ref 20–29)
Calcium: 10.2 mg/dL (ref 8.7–10.2)
Chloride: 101 mmol/L (ref 96–106)
Creatinine, Ser: 1.27 mg/dL (ref 0.76–1.27)
Globulin, Total: 2.7 g/dL (ref 1.5–4.5)
Glucose: 89 mg/dL (ref 70–99)
Potassium: 4.6 mmol/L (ref 3.5–5.2)
Sodium: 140 mmol/L (ref 134–144)
Total Protein: 7.3 g/dL (ref 6.0–8.5)
eGFR: 72 mL/min/1.73 (ref >59)

## 2022-09-29 LAB — TSH: TSH: 2.66 u[IU]/mL (ref 0.450–4.500)

## 2022-09-29 LAB — TESTOSTERONE, FREE, TOTAL, SHBG
Sex Hormone Binding: 28.8 nmol/L (ref 16.5–55.9)
Testosterone, Free: 4.7 pg/mL — ABNORMAL LOW (ref 6.8–21.5)
Testosterone: 407 ng/dL (ref 264–916)

## 2022-09-29 LAB — LIPID PANEL
Chol/HDL Ratio: 3.3 ratio (ref 0.0–5.0)
Cholesterol, Total: 121 mg/dL (ref 100–199)
HDL: 37 mg/dL — ABNORMAL LOW (ref >39)
LDL Chol Calc (NIH): 71 mg/dL (ref 0–99)
Triglycerides: 57 mg/dL (ref 0–149)
VLDL Cholesterol Cal: 13 mg/dL (ref 5–40)

## 2022-09-29 LAB — HEMOGLOBIN A1C
Est. average glucose Bld gHb Est-mCnc: 114 mg/dL
Hgb A1c MFr Bld: 5.6 % (ref 4.8–5.6)

## 2022-09-29 LAB — ESTROGENS, TOTAL: Estrogen: 52 pg/mL — ABNORMAL LOW (ref 56–213)

## 2022-09-30 ENCOUNTER — Other Ambulatory Visit (HOSPITAL_COMMUNITY): Payer: Self-pay

## 2022-09-30 ENCOUNTER — Other Ambulatory Visit: Payer: Self-pay

## 2022-10-06 ENCOUNTER — Other Ambulatory Visit: Payer: Self-pay

## 2022-10-06 ENCOUNTER — Other Ambulatory Visit (HOSPITAL_BASED_OUTPATIENT_CLINIC_OR_DEPARTMENT_OTHER): Payer: Self-pay

## 2022-10-27 ENCOUNTER — Other Ambulatory Visit (HOSPITAL_BASED_OUTPATIENT_CLINIC_OR_DEPARTMENT_OTHER): Payer: Self-pay

## 2022-11-07 ENCOUNTER — Other Ambulatory Visit (HOSPITAL_BASED_OUTPATIENT_CLINIC_OR_DEPARTMENT_OTHER): Payer: Self-pay

## 2022-11-07 MED ORDER — INFLUENZA VIRUS VACC SPLIT PF (FLUZONE) 0.5 ML IM SUSY
0.5000 mL | PREFILLED_SYRINGE | Freq: Once | INTRAMUSCULAR | 0 refills | Status: AC
  Filled 2022-11-07: qty 0.5, 1d supply, fill #0

## 2022-11-11 ENCOUNTER — Other Ambulatory Visit (HOSPITAL_BASED_OUTPATIENT_CLINIC_OR_DEPARTMENT_OTHER): Payer: Self-pay

## 2022-11-14 ENCOUNTER — Other Ambulatory Visit (HOSPITAL_BASED_OUTPATIENT_CLINIC_OR_DEPARTMENT_OTHER): Payer: Self-pay

## 2022-11-22 ENCOUNTER — Other Ambulatory Visit (HOSPITAL_BASED_OUTPATIENT_CLINIC_OR_DEPARTMENT_OTHER): Payer: Self-pay

## 2022-12-29 ENCOUNTER — Other Ambulatory Visit (HOSPITAL_BASED_OUTPATIENT_CLINIC_OR_DEPARTMENT_OTHER): Payer: Self-pay

## 2022-12-29 ENCOUNTER — Other Ambulatory Visit: Payer: Self-pay

## 2023-01-01 ENCOUNTER — Other Ambulatory Visit (HOSPITAL_BASED_OUTPATIENT_CLINIC_OR_DEPARTMENT_OTHER): Payer: Self-pay

## 2023-01-05 ENCOUNTER — Other Ambulatory Visit (HOSPITAL_COMMUNITY): Payer: Self-pay

## 2023-02-05 ENCOUNTER — Other Ambulatory Visit: Payer: Self-pay | Admitting: Sports Medicine

## 2023-02-05 ENCOUNTER — Other Ambulatory Visit: Payer: Self-pay

## 2023-02-05 MED ORDER — CELECOXIB 100 MG PO CAPS
200.0000 mg | ORAL_CAPSULE | Freq: Two times a day (BID) | ORAL | 2 refills | Status: AC
  Filled 2023-02-05: qty 120, 30d supply, fill #0

## 2023-02-13 ENCOUNTER — Other Ambulatory Visit: Payer: Self-pay

## 2023-02-26 ENCOUNTER — Other Ambulatory Visit (HOSPITAL_BASED_OUTPATIENT_CLINIC_OR_DEPARTMENT_OTHER): Payer: Self-pay

## 2023-02-26 MED ORDER — SODIUM FLUORIDE 1.1 % DT GEL
Freq: Every day | DENTAL | 5 refills | Status: AC
  Filled 2023-03-05: qty 100, 30d supply, fill #0

## 2023-03-05 ENCOUNTER — Other Ambulatory Visit: Payer: Self-pay

## 2023-03-11 ENCOUNTER — Ambulatory Visit: Payer: 59 | Admitting: Sports Medicine

## 2023-03-13 ENCOUNTER — Other Ambulatory Visit: Payer: Self-pay

## 2023-04-05 ENCOUNTER — Other Ambulatory Visit: Payer: Self-pay | Admitting: Sports Medicine

## 2023-04-05 DIAGNOSIS — E782 Mixed hyperlipidemia: Secondary | ICD-10-CM

## 2023-04-07 ENCOUNTER — Other Ambulatory Visit: Payer: Self-pay

## 2023-04-07 ENCOUNTER — Other Ambulatory Visit: Payer: Self-pay | Admitting: Sports Medicine

## 2023-04-07 DIAGNOSIS — E782 Mixed hyperlipidemia: Secondary | ICD-10-CM

## 2023-04-08 ENCOUNTER — Other Ambulatory Visit (HOSPITAL_COMMUNITY): Payer: Self-pay

## 2023-04-08 ENCOUNTER — Other Ambulatory Visit: Payer: Self-pay

## 2023-04-08 MED ORDER — ROSUVASTATIN CALCIUM 10 MG PO TABS
10.0000 mg | ORAL_TABLET | Freq: Every day | ORAL | 0 refills | Status: DC
Start: 2023-04-08 — End: 2023-04-23
  Filled 2023-04-08: qty 30, 30d supply, fill #0

## 2023-04-10 ENCOUNTER — Other Ambulatory Visit (HOSPITAL_BASED_OUTPATIENT_CLINIC_OR_DEPARTMENT_OTHER): Payer: Self-pay

## 2023-04-23 ENCOUNTER — Other Ambulatory Visit: Payer: Self-pay

## 2023-04-23 ENCOUNTER — Other Ambulatory Visit (HOSPITAL_BASED_OUTPATIENT_CLINIC_OR_DEPARTMENT_OTHER): Payer: Self-pay

## 2023-04-23 ENCOUNTER — Encounter: Payer: Self-pay | Admitting: Sports Medicine

## 2023-04-23 ENCOUNTER — Other Ambulatory Visit: Payer: Self-pay | Admitting: Sports Medicine

## 2023-04-23 ENCOUNTER — Other Ambulatory Visit (HOSPITAL_COMMUNITY): Payer: Self-pay

## 2023-04-23 DIAGNOSIS — E782 Mixed hyperlipidemia: Secondary | ICD-10-CM

## 2023-04-23 MED ORDER — ROSUVASTATIN CALCIUM 10 MG PO TABS
10.0000 mg | ORAL_TABLET | Freq: Every day | ORAL | 1 refills | Status: DC
Start: 2023-04-23 — End: 2023-11-20
  Filled 2023-04-23 – 2023-05-12 (×2): qty 90, 90d supply, fill #0
  Filled 2023-08-13: qty 90, 90d supply, fill #1

## 2023-04-23 MED ORDER — ROSUVASTATIN CALCIUM 10 MG PO TABS
10.0000 mg | ORAL_TABLET | Freq: Every day | ORAL | 0 refills | Status: DC
Start: 2023-04-23 — End: 2023-04-23
  Filled 2023-04-23: qty 15, 15d supply, fill #0

## 2023-04-23 MED ORDER — FAMOTIDINE 20 MG PO TABS
40.0000 mg | ORAL_TABLET | Freq: Two times a day (BID) | ORAL | 0 refills | Status: DC
  Filled 2023-04-23: qty 60, 15d supply, fill #0

## 2023-04-23 MED ORDER — FAMOTIDINE 20 MG PO TABS
40.0000 mg | ORAL_TABLET | Freq: Two times a day (BID) | ORAL | 1 refills | Status: DC
  Filled 2023-04-23 – 2023-06-22 (×2): qty 180, 45d supply, fill #0
  Filled 2023-12-15: qty 180, 45d supply, fill #1

## 2023-05-12 ENCOUNTER — Other Ambulatory Visit: Payer: Self-pay

## 2023-06-01 ENCOUNTER — Other Ambulatory Visit: Payer: Self-pay

## 2023-06-01 ENCOUNTER — Other Ambulatory Visit: Payer: Self-pay | Admitting: Sports Medicine

## 2023-06-02 ENCOUNTER — Other Ambulatory Visit (HOSPITAL_COMMUNITY): Payer: Self-pay

## 2023-06-02 MED ORDER — OMEPRAZOLE 10 MG PO CPDR
40.0000 mg | DELAYED_RELEASE_CAPSULE | Freq: Every day | ORAL | 3 refills | Status: DC
  Filled 2023-06-02: qty 360, 90d supply, fill #0
  Filled 2023-09-03 (×2): qty 360, 90d supply, fill #1

## 2023-06-03 ENCOUNTER — Other Ambulatory Visit (HOSPITAL_COMMUNITY): Payer: Self-pay

## 2023-06-03 ENCOUNTER — Other Ambulatory Visit: Payer: Self-pay

## 2023-06-23 ENCOUNTER — Other Ambulatory Visit: Payer: Self-pay

## 2023-06-23 ENCOUNTER — Other Ambulatory Visit (HOSPITAL_COMMUNITY): Payer: Self-pay

## 2023-08-06 ENCOUNTER — Other Ambulatory Visit (HOSPITAL_BASED_OUTPATIENT_CLINIC_OR_DEPARTMENT_OTHER): Payer: Self-pay

## 2023-08-10 ENCOUNTER — Other Ambulatory Visit (HOSPITAL_BASED_OUTPATIENT_CLINIC_OR_DEPARTMENT_OTHER): Payer: Self-pay

## 2023-08-13 ENCOUNTER — Other Ambulatory Visit: Payer: Self-pay

## 2023-09-03 ENCOUNTER — Other Ambulatory Visit (HOSPITAL_COMMUNITY): Payer: Self-pay

## 2023-09-03 ENCOUNTER — Other Ambulatory Visit (HOSPITAL_BASED_OUTPATIENT_CLINIC_OR_DEPARTMENT_OTHER): Payer: Self-pay

## 2023-09-15 ENCOUNTER — Other Ambulatory Visit (HOSPITAL_BASED_OUTPATIENT_CLINIC_OR_DEPARTMENT_OTHER): Payer: Self-pay

## 2023-09-21 ENCOUNTER — Other Ambulatory Visit (HOSPITAL_BASED_OUTPATIENT_CLINIC_OR_DEPARTMENT_OTHER): Payer: Self-pay

## 2023-09-23 ENCOUNTER — Other Ambulatory Visit (HOSPITAL_BASED_OUTPATIENT_CLINIC_OR_DEPARTMENT_OTHER): Payer: Self-pay

## 2023-09-24 ENCOUNTER — Other Ambulatory Visit (HOSPITAL_BASED_OUTPATIENT_CLINIC_OR_DEPARTMENT_OTHER): Payer: Self-pay

## 2023-09-29 ENCOUNTER — Other Ambulatory Visit: Payer: Self-pay

## 2023-09-29 ENCOUNTER — Encounter: Payer: Self-pay | Admitting: Urgent Care

## 2023-09-29 ENCOUNTER — Ambulatory Visit: Payer: Self-pay

## 2023-09-29 ENCOUNTER — Other Ambulatory Visit (HOSPITAL_BASED_OUTPATIENT_CLINIC_OR_DEPARTMENT_OTHER): Payer: Self-pay

## 2023-09-29 ENCOUNTER — Encounter: Payer: Self-pay | Admitting: Sports Medicine

## 2023-09-29 ENCOUNTER — Ambulatory Visit: Admitting: Urgent Care

## 2023-09-29 VITALS — BP 119/84 | HR 78 | Resp 18 | Ht 70.5 in | Wt 233.8 lb

## 2023-09-29 DIAGNOSIS — I809 Phlebitis and thrombophlebitis of unspecified site: Secondary | ICD-10-CM

## 2023-09-29 DIAGNOSIS — B351 Tinea unguium: Secondary | ICD-10-CM

## 2023-09-29 DIAGNOSIS — R600 Localized edema: Secondary | ICD-10-CM

## 2023-09-29 DIAGNOSIS — I8002 Phlebitis and thrombophlebitis of superficial vessels of left lower extremity: Secondary | ICD-10-CM | POA: Diagnosis not present

## 2023-09-29 DIAGNOSIS — I83892 Varicose veins of left lower extremities with other complications: Secondary | ICD-10-CM | POA: Diagnosis not present

## 2023-09-29 DIAGNOSIS — L03116 Cellulitis of left lower limb: Secondary | ICD-10-CM | POA: Diagnosis not present

## 2023-09-29 MED ORDER — CEPHALEXIN 500 MG PO CAPS
500.0000 mg | ORAL_CAPSULE | Freq: Four times a day (QID) | ORAL | 0 refills | Status: AC
  Filled 2023-09-29 (×2): qty 28, 7d supply, fill #0

## 2023-09-29 NOTE — Progress Notes (Signed)
 Established Patient Office Visit  Subjective:  Patient ID: Angel Marshall, male    DOB: Feb 03, 1981  Age: 43 y.o. MRN: [id number]  Chief Complaint  Patient presents with   DVT    Possible DVT pt noticed a nodule on LL right of knee cap x5days ago and within the last 24-48 hrs he has had swelling, pain, and redness     HPI  Discussed the use of AI scribe software for clinical note transcription with the patient, who gave verbal consent to proceed.  History of Present Illness   Angel Marshall is a 43 year old male with varicose veins who presents with new onset swelling and redness in the left leg.  He has had varicose veins for over a decade, which he attributes to his lifestyle of being on his feet frequently. Approximately five days ago, he noticed a new area of swelling and redness on his left leg (medially near patella), which is sensitive to touch and warm. He reports that five days ago, [place] to [facility], he noticed the new area of swelling and redness on his left leg. The swelling and redness have worsened over the past 48 hours, with discomfort being more pronounced in the morning upon standing and improving with activity.  No pain extends past the knee, and there is no thigh pain, groin pain, shortness of breath, or chest pain. He denies any personal history of blood clots.  He has a family history of deep vein thrombosis (DVT), as his father experienced a significant episode of DVTs at the age of 57 or 72. However, there are no known clotting disorders in the family.  For symptom relief, he has taken 400 mg of ibuprofen intermittently and 81 mg of aspirin, which help with the discomfort. He also took 100 mg of celecoxib  this morning.  In terms of social history, he does not smoke and occasionally walks barefoot. 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   Annual physical exam 03/02/2017   Cranial nerve dysfunction 03/02/2017   Chronic right shoulder pain 03/02/2017   Lumbar degenerative disc disease 03/02/2017   Dysplastic nevus 03/02/2017   History of appendectomy 09/25/2015   Left knee pain 08/08/2015   Past Medical History:  Diagnosis Date   GERD (gastroesophageal reflux disease)    Past Surgical History:  Procedure Laterality Date   APPENDECTOMY  09/2015   LAPAROSCOPIC APPENDECTOMY N/A 09/25/2015   Procedure: APPENDECTOMY LAPAROSCOPIC;  Surgeon: Debby Shipper, MD;  Location: [facility] OR;  Service: General;  Laterality: N/A;   UMBILECTOMY     UMBILICAL SURGERY     Social History   Tobacco Use   Smoking status: Never   Smokeless tobacco: Never  Substance Use Topics   Alcohol use: No    Alcohol/week: 0.0 standard drinks of alcohol   Drug use: No      ROS: as noted in HPI  Objective:     BP 119/84   Pulse 78   Resp 18   Ht 5' 10.5 (1.791 m)  Wt 233 lb 12 oz (106 kg)   SpO2 98%   BMI 33.07 kg/m  BP Readings from Last 3 Encounters:  09/29/23 119/84  09/02/22 113/71  06/11/21 101/68   Wt Readings from Last 3 Encounters:  09/29/23 233 lb 12 oz (106 kg)  09/02/22 237 lb (107.5 kg)  06/11/21 212 lb (96.2 kg)      Physical Exam Vitals and nursing note reviewed.  Constitutional:      Appearance: Normal appearance.  Cardiovascular:     Rate and Rhythm: Normal rate.     Pulses: Normal pulses.  Pulmonary:     Effort: Pulmonary effort is normal. No respiratory distress.  Musculoskeletal:     Comments: Significant varicosities noted to  LLE Superficial thrombophlebitis noted to L medial calf superiorly. Erythema with mild warmth noted to distal anterior LLE without significant edema. Negative Homan sign. No calf tenderness Pulses normal and intact. Significant onychomycosis to L toenails. R calf measured 16.25 L calf measured 16.75  Skin:    General: Skin is warm and dry.     Findings: Erythema present.  Neurological:     Mental Status: He is alert.      No results found for any visits on 09/29/23.  Last CBC Lab Results  Component Value Date   WBC 6.2 09/24/2022   HGB 16.7 09/24/2022   HCT 50.5 09/24/2022   MCV 87 09/24/2022   MCH 28.9 09/24/2022   RDW 11.9 09/24/2022   PLT 199 09/24/2022   Last metabolic panel Lab Results  Component Value Date   GLUCOSE 89 09/24/2022   NA 140 09/24/2022   K 4.6 09/24/2022   CL 101 09/24/2022   CO2 25 09/24/2022   BUN 18 09/24/2022   CREATININE 1.27 09/24/2022   EGFR 72 09/24/2022   CALCIUM  10.2 09/24/2022   PROT 7.3 09/24/2022   ALBUMIN 4.6 09/24/2022   LABGLOB 2.7 09/24/2022   AGRATIO 1.8 06/15/2017   BILITOT 0.8 09/24/2022   ALKPHOS 85 09/24/2022   AST 34 09/24/2022   ALT 32 09/24/2022   ANIONGAP 8 09/24/2015   Last lipids Lab Results  Component Value Date   CHOL 121 09/24/2022   HDL 37 (L) 09/24/2022   LDLCALC 71 09/24/2022   TRIG 57 09/24/2022   CHOLHDL 3.3 09/24/2022   Last hemoglobin A1c Lab Results  Component Value Date   HGBA1C 5.6 09/24/2022      The ASCVD Risk score (Arnett DK, et al., 2019) failed to calculate for the following reasons:   The valid total cholesterol range is 130 to 320 mg/dL  Assessment & Plan:  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  Dispense: 28 capsule; Refill: 0 -     CBC with Differential/Platelet   Assessment and Plan    Superficial thrombophlebitis, left lower extremity Suspected superficial thrombophlebitis in the left lower extremity with differential diagnosis including DVT and cellulitis. No DVT signs present. Family  history of DVT noted. Symptoms improve with activity, worsen in the morning. Ibuprofen and aspirin provided relief. Calf not firm, reassuring against DVT. - Order vascular ultrasound to rule out DVT. - Order stat D-dimer test. - Prescribe cephalexin  for potential cellulitis. - Continue ibuprofen for pain management.  Varicose veins of lower extremities Chronic varicose veins in lower extremities for over a decade. Compression stockings not consistently worn recently.  Onychomycosis, left foot Significant onychomycosis in left foot, potential cellulitis risk. Previous specialist referral not followed up. - consider oral terbinafine  therapy in the future        Return in about 2 weeks (around 10/13/2023).   Benton LITTIE Gave, PA

## 2023-09-29 NOTE — Telephone Encounter (Signed)
 This RN verified call back number as [phone] . Patient is a Dr. ONEIDA patient. Called [facility], office closed for lunch. Will route to office as high priority for follow up.       Copied from CRM #[id number]. Topic: Clinical - Red Word Triage >> Sep 29, 2023 12:16 PM Merlynn LABOR wrote: Red Word that prompted transfer to Nurse Triage: Similar symptoms of DVT, swelling, redness Reason for Disposition  [1] Caller requests to speak ONLY to PCP AND [2] URGENT question  Answer Assessment - Initial Assessment Questions Patient called stating he has possible signs and symptoms of a DVT. Symptoms started 5 days ago. Currently he has an area on leg that is 3 cm raised and 3 cm wide. He states that he is having some swelling going down his leg. He states he also has had some redness and tenderness going down the leg as well.    1. REASON FOR CALL or QUESTION: What is your reason for calling today? or How can I best     Lower leg swelling  2. CALLER: Document the source of call. (e.g., laboratory staff, caregiver or patient).     Patient  Protocols used: PCP Call - No Triage-A-AH

## 2023-09-29 NOTE — Patient Instructions (Signed)
 I suspect you have superficial thrombophlebitis in addition to a developing cellulitis Please apply a warm compress to the affected area of the inner thigh. Take 600mg  ibuprofen 4 times daily with food. Take cephalexin  4 time daily x 7 days.  Please come around 10:50am to [address] tomorrow for a DVT ultrasound.  Follow up with PCP in 1-2 weeks.

## 2023-09-29 NOTE — Telephone Encounter (Signed)
 Patient seen by Benton crain in office

## 2023-09-30 ENCOUNTER — Other Ambulatory Visit (HOSPITAL_BASED_OUTPATIENT_CLINIC_OR_DEPARTMENT_OTHER): Payer: Self-pay

## 2023-09-30 ENCOUNTER — Ambulatory Visit: Payer: Self-pay | Admitting: Urgent Care

## 2023-09-30 ENCOUNTER — Telehealth: Payer: Self-pay | Admitting: Sports Medicine

## 2023-09-30 ENCOUNTER — Ambulatory Visit (INDEPENDENT_AMBULATORY_CARE_PROVIDER_SITE_OTHER)

## 2023-09-30 DIAGNOSIS — R600 Localized edema: Secondary | ICD-10-CM

## 2023-09-30 DIAGNOSIS — I8002 Phlebitis and thrombophlebitis of superficial vessels of left lower extremity: Secondary | ICD-10-CM | POA: Diagnosis not present

## 2023-09-30 LAB — CBC WITH DIFFERENTIAL/PLATELET
Basophils Absolute: 0.1 x10E3/uL (ref 0.0–0.2)
Basos: 1 %
EOS (ABSOLUTE): 0.2 x10E3/uL (ref 0.0–0.4)
Eos: 2 %
Hematocrit: 48.5 % (ref 37.5–51.0)
Hemoglobin: 15.9 g/dL (ref 13.0–17.7)
Immature Grans (Abs): 0 x10E3/uL (ref 0.0–0.1)
Immature Granulocytes: 0 %
Lymphocytes Absolute: 2.5 x10E3/uL (ref 0.7–3.1)
Lymphs: 28 %
MCH: 29.6 pg (ref 26.6–33.0)
MCHC: 32.8 g/dL (ref 31.5–35.7)
MCV: 90 fL (ref 79–97)
Monocytes Absolute: 0.8 x10E3/uL (ref 0.1–0.9)
Monocytes: 9 %
Neutrophils Absolute: 5.4 x10E3/uL (ref 1.4–7.0)
Neutrophils: 60 %
Platelets: 197 x10E3/uL (ref 150–450)
RBC: 5.37 x10E6/uL (ref 4.14–5.80)
RDW: 11.8 % (ref 11.6–15.4)
WBC: 9 x10E3/uL (ref 3.4–10.8)

## 2023-09-30 LAB — D-DIMER, QUANTITATIVE: D-DIMER: 1.05 mg{FEU}/L — ABNORMAL HIGH (ref 0.00–0.49)

## 2023-09-30 NOTE — Telephone Encounter (Signed)
  FYI Only or Action Required?: Action required by provider: Dr. Terri called to report that stat ultrasound for lower extremities showed IMPRESSION:.  No evidence of deep vein thrombosis.   Anechoic tubular structure along the medial aspect of the knee suggestive of superficial vein thrombosis involving the left greater saphenous vein. Differentials include subcutaneous complex cystic lesions. Recommend correlation with clinical exam an MRI knee if clinically warranted.   Patient was last seen in primary care on 09/29/2023 by Lowella Benton CROME, PA.  Called Nurse Triage reporting Results.  Symptoms began today.  Interventions attempted: Nothing.  Symptoms are: unchanged.  Triage Disposition: No disposition on file.  Patient/caregiver understands and will follow disposition?:     Copied from CRM [id number]. Topic: Clinical - Red Word Triage >> Sep 30, 2023 12:12 PM Kevelyn M wrote: Red Word that prompted transfer to Nurse Triage: Stat ultrasound for lower extremities. Dr. ZARE is on the line.

## 2023-09-30 NOTE — Telephone Encounter (Signed)
 Pt was notified via Mychart message. Do you mind also calling him please to make sure he is aware? No dvt but superficial venous thrombosis which is what I suspected. Warm compresses, ibuprofen 600mg  Tid, keflex  QID and compression hose. Keep follow up on 8/28 with Dr. ONEIDA. thanks

## 2023-09-30 NOTE — Telephone Encounter (Signed)
 Last read by Morene JAYSON Delores Odis at 8:55PM on 09/29/2023.

## 2023-10-05 ENCOUNTER — Other Ambulatory Visit: Payer: Self-pay

## 2023-10-05 ENCOUNTER — Other Ambulatory Visit (HOSPITAL_BASED_OUTPATIENT_CLINIC_OR_DEPARTMENT_OTHER): Payer: Self-pay

## 2023-10-05 NOTE — Telephone Encounter (Signed)
 Did he finish the keflex ? Is he wearing compression

## 2023-10-05 NOTE — Telephone Encounter (Signed)
 Attempted call to patient. Left a voice mail message requesting a return call.

## 2023-10-05 NOTE — Telephone Encounter (Signed)
 Pt viewed on MyChart. Annabella Rigg, CMA

## 2023-10-06 NOTE — Telephone Encounter (Signed)
 Spoke with patient. He will take the last Keflex  capsule today.   He stopped the compression stockings as they were causing his pain to worsen. He is still doing warm compresses but he is not sure if this is helping or making the pain worse as well.  He has appt tomorrow with Benton crain ( also forwarding message to her as well)

## 2023-10-06 NOTE — Telephone Encounter (Signed)
 Good to go ahead and refer him to a vein specialist I am glad he is coming in tomorrow but I also want to go ahead and place the referral.  I do think some of this is inflammation from the blood vessels.

## 2023-10-07 ENCOUNTER — Other Ambulatory Visit: Payer: Self-pay

## 2023-10-07 ENCOUNTER — Encounter: Payer: Self-pay | Admitting: Urgent Care

## 2023-10-07 ENCOUNTER — Ambulatory Visit: Admitting: Urgent Care

## 2023-10-07 ENCOUNTER — Other Ambulatory Visit (HOSPITAL_BASED_OUTPATIENT_CLINIC_OR_DEPARTMENT_OTHER): Payer: Self-pay

## 2023-10-07 VITALS — BP 121/84 | HR 80 | Resp 18 | Ht 70.5 in | Wt 233.0 lb

## 2023-10-07 DIAGNOSIS — E782 Mixed hyperlipidemia: Secondary | ICD-10-CM

## 2023-10-07 DIAGNOSIS — Z0000 Encounter for general adult medical examination without abnormal findings: Secondary | ICD-10-CM | POA: Diagnosis not present

## 2023-10-07 DIAGNOSIS — I83892 Varicose veins of left lower extremities with other complications: Secondary | ICD-10-CM | POA: Diagnosis not present

## 2023-10-07 DIAGNOSIS — I8002 Phlebitis and thrombophlebitis of superficial vessels of left lower extremity: Secondary | ICD-10-CM | POA: Diagnosis not present

## 2023-10-07 DIAGNOSIS — B351 Tinea unguium: Secondary | ICD-10-CM | POA: Diagnosis not present

## 2023-10-07 DIAGNOSIS — I891 Lymphangitis: Secondary | ICD-10-CM | POA: Diagnosis not present

## 2023-10-07 DIAGNOSIS — R5383 Other fatigue: Secondary | ICD-10-CM

## 2023-10-07 DIAGNOSIS — Z125 Encounter for screening for malignant neoplasm of prostate: Secondary | ICD-10-CM

## 2023-10-07 MED ORDER — TERBINAFINE HCL 250 MG PO TABS
250.0000 mg | ORAL_TABLET | Freq: Every day | ORAL | 0 refills | Status: DC
  Filled 2023-10-07 (×2): qty 90, 90d supply, fill #0

## 2023-10-07 MED ORDER — AMOXICILLIN-POT CLAVULANATE 875-125 MG PO TABS
1.0000 | ORAL_TABLET | Freq: Two times a day (BID) | ORAL | 0 refills | Status: DC
  Filled 2023-10-07 (×2): qty 20, 10d supply, fill #0

## 2023-10-07 MED ORDER — CICLOPIROX 8 % EX SOLN
Freq: Every day | CUTANEOUS | 4 refills | Status: AC
  Filled 2023-10-07 – 2023-11-24 (×3): qty 6.6, 30d supply, fill #0

## 2023-10-07 NOTE — Patient Instructions (Addendum)
 [facility] Address: [address], [address] [place], [place] [address] Phone: [phone]  Please call the number above to get an urgent referral.  Start taking augmentin  twice daily with food. Continue the compresses and NSAIDs.  Please start terbinafine  daily. Take for 90 days. Will need to recheck liver function tests after 6 weeks of use. I have also called in topical Penlac .  Please schedule your annual physical.

## 2023-10-07 NOTE — Progress Notes (Signed)
 Established Patient Office Visit  Subjective:  Patient ID: Angel Marshall, male    DOB: [birth year]  Age: 43 y.o. MRN: [id number]  Chief Complaint  Patient presents with   Transitions Of Care    HPI   Discussed the use of AI scribe software for clinical note transcription with the patient, who gave verbal consent to proceed.  History of Present Illness   Angel Marshall is a 43 year old male who presents with worsening skin infection and pain.  A few days after his last visit, his condition did not improve with the use of Keflex , which was initially prescribed for suspected cellulitis. Instead, he experienced worsening symptoms and the appearance of a new painful area. He describes a hard, painless white strip that appeared three days ago. The pain in one area has resolved, but the new area is painful.  He has been taking ibuprofen for pain management, initially at 800 mg per day for two days, followed by 1800 mg per day (600 mg three times a day) for the next five days. Ibuprofen helps with the pain but not significantly with inflammation. He also mentions that not wearing compression hose has helped alleviate some symptoms.  He has a history of toenail fungus and has previously tried terbinafine  for six months but discontinued it. He is considering restarting terbinafine  and possibly adding a topical therapy. He takes rosuvastatin  5 mg without any past liver function issues. He drinks alcohol infrequently, about three times a year.  He engages in physical activity, such as jogging two and a half to three miles while playing disc golf, which seemed to improve his symptoms. He plans to go on a significant hike soon and typically consumes about 400 mg of caffeine over a three-hour period during such activities.  No shortness of breath, chest pain, or symptoms related to the chest. He has not seen a vascular specialist before.      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   Annual physical exam 03/02/2017   Cranial nerve dysfunction 03/02/2017   Chronic right shoulder pain 03/02/2017   Lumbar degenerative disc disease 03/02/2017   Dysplastic nevus 03/02/2017   History of appendectomy 09/25/2015   Left knee pain 08/08/2015   Past Medical History:  Diagnosis Date   GERD (gastroesophageal reflux disease)    Past Surgical History:  Procedure Laterality Date   APPENDECTOMY  09/2015   LAPAROSCOPIC APPENDECTOMY N/A 09/25/2015   Procedure: APPENDECTOMY LAPAROSCOPIC;  Surgeon: Debby Shipper, MD;  Location: [facility] OR;  Service: General;  Laterality: N/A;   UMBILECTOMY     UMBILICAL SURGERY     Social History   Tobacco Use   Smoking status: Never   Smokeless tobacco: Never  Substance Use Topics   Alcohol use: No    Alcohol/week: 0.0 standard drinks of alcohol   Drug use: No      ROS: as noted in HPI  Objective:     BP 121/84   Pulse 80   Resp 18   Ht 5' 10.5 (1.791 m)   Wt 233 lb (105.7 kg)   SpO2 99%   BMI 32.96 kg/m  BP Readings from Last 3 Encounters:  10/07/23  121/84  09/29/23 119/84  09/02/22 113/71   Wt Readings from Last 3 Encounters:  10/07/23 233 lb (105.7 kg)  09/29/23 233 lb 12 oz (106 kg)  09/02/22 237 lb (107.5 kg)      Physical Exam Vitals and nursing note reviewed.  Constitutional:      Appearance: Normal appearance.  Cardiovascular:     Rate and Rhythm: Normal rate.     Pulses: Normal pulses.  Pulmonary:     Effort: Pulmonary effort is normal. No respiratory distress.  Musculoskeletal:     Comments: Significant varicosities noted to LLE Superficial thrombophlebitis noted to L medial  calf superiorly. Erythema with mild warmth noted to distal anterior LLE and spreading up leg over vein without significant edema. Negative Homan sign. No calf tenderness Pulses normal and intact. Significant onychomycosis to L toenails.   Feet:     Right foot:     Toenail Condition: Right toenails are abnormally thick. Fungal disease present.    Left foot:     Toenail Condition: Left toenails are abnormally thick. Fungal disease present. Skin:    General: Skin is warm and dry.     Findings: Erythema present.  Neurological:     Mental Status: He is alert.      No results found for any visits on 10/07/23.    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  Dispense: 20 tablet; Refill: 0  Mixed hyperlipidemia -     Lipid panel  Other fatigue -     CBC with Differential/Platelet -     Hemoglobin A1c -     TSH -     Comprehensive metabolic panel with GFR -     Testosterone ,Free and Total  Screening for prostate cancer -     PSA  Routine adult health maintenance -     CBC with Differential/Platelet -     Hemoglobin A1c -     TSH -     Lipid panel -     Comprehensive metabolic panel with GFR -     PSA -     Testosterone ,Free and Total  Onychomycosis -     Terbinafine  HCl; Take 1 tablet (250 mg total) by mouth daily.  Dispense: 90 tablet; Refill: 0 -     Ciclopirox ; Apply topically at bedtime. Apply over nail and surrounding skin. Apply daily over previous coat. After seven (7) days, may remove with alcohol and continue  cycle.  Dispense: 6.6 mL; Refill: 4  Assessment and Plan    Lymphangitis and superficial thrombophlebitis of lower extremity with venous congestion syndrome Condition unresponsive to Keflex , indicating need for more aggressive treatment. Likely exacerbated by venous congestion syndrome due to stasis and broken valves. - Switch antibiotic to Augmentin  for 10 days. - Refer urgently to vascular or vein specialist. - Continue ibuprofen for pain management. - Discontinue compression hose. - Encourage physical activity, such as hiking. - Consider aspirin if needed, but ibuprofen is usually sufficient. - Advise to contact vascular  specialist immediately after visit.  Onychomycosis (toenail fungus) Previous terbinafine  treatment was discontinued prematurely. Willing to retry terbinafine  and possibly add ciclopirox . Aware of treatment targeting new nail growth and slow nail growth. Liver function monitoring necessary. - Restart terbinafine  and consider adding ciclopirox  for topical therapy. - Monitor liver function tests during terbinafine  treatment.  Fatigue Fatigue present, comprehensive evaluation planned to assess potential underlying causes. - Order routine annual labs, including testosterone  panel.         No follow-ups on file.   Benton LITTIE Gave, PA

## 2023-10-07 NOTE — Telephone Encounter (Signed)
 Pt has an appointment with me today at 8:20am. Will address these complaints. Thanks

## 2023-10-08 ENCOUNTER — Encounter: Payer: Self-pay | Admitting: Urgent Care

## 2023-10-08 ENCOUNTER — Encounter: Admitting: Sports Medicine

## 2023-10-12 LAB — CBC WITH DIFFERENTIAL/PLATELET
Basophils Absolute: 0.1 x10E3/uL (ref 0.0–0.2)
Basos: 1 %
EOS (ABSOLUTE): 0.2 x10E3/uL (ref 0.0–0.4)
Eos: 3 %
Hematocrit: 48.2 % (ref 37.5–51.0)
Hemoglobin: 16.4 g/dL (ref 13.0–17.7)
Immature Grans (Abs): 0 x10E3/uL (ref 0.0–0.1)
Immature Granulocytes: 0 %
Lymphocytes Absolute: 1.9 x10E3/uL (ref 0.7–3.1)
Lymphs: 31 %
MCH: 30.5 pg (ref 26.6–33.0)
MCHC: 34 g/dL (ref 31.5–35.7)
MCV: 90 fL (ref 79–97)
Monocytes Absolute: 0.6 x10E3/uL (ref 0.1–0.9)
Monocytes: 10 %
Neutrophils Absolute: 3.5 x10E3/uL (ref 1.4–7.0)
Neutrophils: 55 %
Platelets: 207 x10E3/uL (ref 150–450)
RBC: 5.37 x10E6/uL (ref 4.14–5.80)
RDW: 12.1 % (ref 11.6–15.4)
WBC: 6.2 x10E3/uL (ref 3.4–10.8)

## 2023-10-12 LAB — COMPREHENSIVE METABOLIC PANEL WITH GFR
ALT: 43 IU/L (ref 0–44)
AST: 38 IU/L (ref 0–40)
Albumin: 4.4 g/dL (ref 4.1–5.1)
Alkaline Phosphatase: 80 IU/L (ref 44–121)
BUN/Creatinine Ratio: 16 (ref 9–20)
BUN: 17 mg/dL (ref 6–24)
Bilirubin Total: 0.5 mg/dL (ref 0.0–1.2)
CO2: 23 mmol/L (ref 20–29)
Calcium: 9.4 mg/dL (ref 8.7–10.2)
Chloride: 102 mmol/L (ref 96–106)
Creatinine, Ser: 1.05 mg/dL (ref 0.76–1.27)
Globulin, Total: 2.7 g/dL (ref 1.5–4.5)
Glucose: 88 mg/dL (ref 70–99)
Potassium: 4.5 mmol/L (ref 3.5–5.2)
Sodium: 139 mmol/L (ref 134–144)
Total Protein: 7.1 g/dL (ref 6.0–8.5)
eGFR: 90 mL/min/1.73 (ref >59)

## 2023-10-12 LAB — TSH: TSH: 2.63 u[IU]/mL (ref 0.450–4.500)

## 2023-10-12 LAB — HEMOGLOBIN A1C
Est. average glucose Bld gHb Est-mCnc: 108 mg/dL
Hgb A1c MFr Bld: 5.4 % (ref 4.8–5.6)

## 2023-10-12 LAB — TESTOSTERONE,FREE AND TOTAL
Testosterone, Free: 6.6 pg/mL — ABNORMAL LOW (ref 6.8–21.5)
Testosterone: 411 ng/dL (ref 264–916)

## 2023-10-12 LAB — LIPID PANEL
Chol/HDL Ratio: 3.5 ratio (ref 0.0–5.0)
Cholesterol, Total: 122 mg/dL (ref 100–199)
HDL: 35 mg/dL — ABNORMAL LOW (ref >39)
LDL Chol Calc (NIH): 73 mg/dL (ref 0–99)
Triglycerides: 66 mg/dL (ref 0–149)
VLDL Cholesterol Cal: 14 mg/dL (ref 5–40)

## 2023-10-12 LAB — PSA: Prostate Specific Ag, Serum: 0.6 ng/mL (ref 0.0–4.0)

## 2023-10-13 ENCOUNTER — Ambulatory Visit: Admitting: Sports Medicine

## 2023-10-13 ENCOUNTER — Encounter: Payer: Self-pay | Admitting: Sports Medicine

## 2023-10-13 ENCOUNTER — Ambulatory Visit: Payer: Self-pay | Admitting: Urgent Care

## 2023-10-16 ENCOUNTER — Other Ambulatory Visit (HOSPITAL_BASED_OUTPATIENT_CLINIC_OR_DEPARTMENT_OTHER): Payer: Self-pay

## 2023-10-20 ENCOUNTER — Other Ambulatory Visit (HOSPITAL_BASED_OUTPATIENT_CLINIC_OR_DEPARTMENT_OTHER): Payer: Self-pay

## 2023-10-21 ENCOUNTER — Other Ambulatory Visit (HOSPITAL_BASED_OUTPATIENT_CLINIC_OR_DEPARTMENT_OTHER): Payer: Self-pay

## 2023-10-26 ENCOUNTER — Ambulatory Visit: Payer: Self-pay

## 2023-10-26 ENCOUNTER — Other Ambulatory Visit (HOSPITAL_COMMUNITY): Payer: Self-pay

## 2023-10-26 ENCOUNTER — Encounter: Payer: Self-pay | Admitting: Internal Medicine

## 2023-10-26 ENCOUNTER — Ambulatory Visit: Admitting: Internal Medicine

## 2023-10-26 VITALS — BP 120/82 | HR 79 | Temp 98.2°F | Ht 70.5 in | Wt 233.0 lb

## 2023-10-26 DIAGNOSIS — I8002 Phlebitis and thrombophlebitis of superficial vessels of left lower extremity: Secondary | ICD-10-CM | POA: Diagnosis not present

## 2023-10-26 MED ORDER — AMOXICILLIN-POT CLAVULANATE 875-125 MG PO TABS
1.0000 | ORAL_TABLET | Freq: Two times a day (BID) | ORAL | 0 refills | Status: AC
  Filled 2023-10-26: qty 20, 10d supply, fill #0

## 2023-10-26 NOTE — Patient Instructions (Addendum)
      Medications changes include :   Augmentin     An US  of your LLE was ordered and someone will call you to schedule an appointment.

## 2023-10-26 NOTE — Telephone Encounter (Signed)
 Patient scheduled.

## 2023-10-26 NOTE — Progress Notes (Signed)
 Subjective:    Patient ID: Angel Marshall, male    DOB: 1980/03/15, 43 y.o.   MRN: [id number]      HPI Angel Marshall is here for  Chief Complaint  Patient presents with   Calf swelling    Left leg calf swelling    Discussed the use of AI scribe software for clinical note transcription with the patient, who gave verbal consent to proceed.  History of Present Illness Angel Marshall is a 43 year old male who presents with worsening leg pain and swelling.  He has had varicose veins for approximately fifteen years and sought treatment about four to five weeks ago due to worsening symptoms. Initially, he started wearing compression hose, which led to the development of a superficial thrombophlebitis in the left medial leg by his knee. An ultrasound performed at [facility] was negative for deep vein thrombosis (DVT). He was started on Cefalexin for one week, which provided some improvement, but symptoms returned near the end of the treatment.  Following the initial treatment, he was diagnosed with superficial thrombophlebitis and lymphangitis. He started Augmentin  two and a half weeks ago for either seven or ten days. During this period, he noticed small superficial hyperpigmented spots/bruising appearing on his leg, which generally resolved until he resumed wearing compression hose five days after stopping Augmentin . This led to worsening symptoms over the following days, including increased redness, warmth, and pain in the leg.  The leg pain worsens when standing up and improves with activity, such as light jogging and playing disc golf.  He has been using NSAIDs, initially ibuprofen two to three times a day, and switched to celecoxib  twice daily since Saturday. He also takes famotidine  and omeprazole  for stomach protection.  He has been applying heat to the affected area, which seems to exacerbate the inflammation rather than provide relief. The leg is quite warm to touch and  painful, especially when coughing or standing. No fever or respiratory issues.   He is supposed to travel in 4 weeks but is concerned about his current symptoms and being able to travel.   Medications and allergies reviewed with patient and updated if appropriate.  Current Outpatient Medications on File Prior to Visit  Medication Sig Dispense Refill   amoxicillin -clavulanate (AUGMENTIN ) 875-125 MG tablet Take 1 tablet by mouth 2 (two) times daily with a meal. 20 tablet 0   celecoxib  (CELEBREX ) 100 MG capsule Take 2 capsules (200 mg total) by mouth 2 (two) times daily. 120 capsule 2   ciclopirox  (PENLAC ) 8 % solution Apply topically at bedtime. Apply over nail and surrounding skin. Apply daily over previous coat. After seven (7) days, may remove with alcohol and continue cycle. 6.6 mL 4   famotidine  (PEPCID ) 20 MG tablet Take 2 tablets (40 mg total) by mouth 2 (two) times daily. 180 tablet 1   fluticasone  (FLONASE ) 50 MCG/ACT nasal spray PLACE 1 SPRAY INTO EACH NOSTRIL TWO TIMES DAILY. USE THE LEFT HAND FOR THE RIGHT NOSTRIL AND USE THE RIGHT HAND FOR THE LEFT NOSTRIL 48 g 3   omeprazole  (PRILOSEC) 10 MG capsule Take 4 capsules (40 mg total) by mouth daily. NEEDS APPOINTMENT FOR FURTHER REFILLS 360 capsule 3   rosuvastatin  (CRESTOR ) 10 MG tablet Take 1 tablet (10 mg total) by mouth daily. 90 tablet 1   sodium fluoride  (FLUORISHIELD) 1.1 % GEL dental gel Brush once daily 100 mL 5   terbinafine  (LAMISIL ) 250 MG tablet Take 1 tablet (250 mg total) by  mouth daily. 90 tablet 0   No current facility-administered medications on file prior to visit.    Review of Systems     Objective:   Vitals:   10/26/23 1541  BP: 120/82  Pulse: 79  Temp: 98.2 F (36.8 C)  SpO2: 95%   BP Readings from Last 3 Encounters:  10/26/23 120/82  10/07/23 121/84  09/29/23 119/84   Wt Readings from Last 3 Encounters:  10/26/23 233 lb (105.7 kg)  10/07/23 233 lb (105.7 kg)  09/29/23 233 lb 12 oz (106 kg)    Body mass index is 32.96 kg/m.    Physical Exam Constitutional:      General: He is not in acute distress.    Appearance: Normal appearance. He is not ill-appearing.  HENT:     Head: Normocephalic and atraumatic.  Cardiovascular:     Comments: Medial left knee there is an area of erythema which a large part is indurated.  Area is warm, tender to touch.  No fluctuance.  Varicose veins noted and there is palpable superficial blood clots and more than 1 vein-tender.  Distal left lower leg areas of bruising that appear to be along the affected vein with thrombophlebitis Skin:    General: Skin is warm and dry.  Neurological:     Mental Status: He is alert.            Assessment & Plan:   Assessment and Plan Assessment & Plan Superficial thrombophlebitis of left lower extremity Symptoms have worsened and clot appears more extensive and he has recurrent cellulitis Has taken cephalexin  and Augmentin  with improvement, but subsequent worsening after restarting use of compression hose Concern for deep vein involvement due to increased clot size. - Order ultrasound of left lower extremity at [facility] to assess for deep vein involvement. - Prescribe Augmentin  twice daily for 10 days. - Advise against using compression socks until after vascular consultation.  - Continue Celecoxib  for inflammation control and 200 mg twice daily-continue stomach protection - Consider short course of Eliquis if ultrasound indicates deep vein involvement. - Advise on the use of heat; discontinue if it exacerbates symptoms. - Follow up with vascular specialist on November 26th, remain on waitlist for earlier appointment.  Depending on ultrasound and they will warrant a sooner appointment

## 2023-10-26 NOTE — Telephone Encounter (Signed)
 FYI Only or Action Required?: FYI only for provider.  Patient was last seen in primary care on 10/07/2023 by Lowella Benton CROME, PA.  Called Nurse Triage reporting Leg Pain.  Symptoms began several days ago.  Interventions attempted: Nothing.  Symptoms are: gradually worsening.  Triage Disposition: See HCP Within 4 Hours (Or PCP Triage)  Patient/caregiver understands and will follow disposition?: Yes, appt scheduled at [facility] d/t availability.       Copied from CRM #[id number]. Topic: Clinical - Red Word Triage >> Oct 26, 2023 10:05 AM Brittney F  : Red Word that prompted transfer to Nurse Triage:   Concern: Clot inner left knee  Symptoms: red, swelling, hot to the touch, pain in clot area   When did the symptoms start?: worsened over the weekend  but has been an issue for a few weeks almost a month   What have you done to aid in the concern ? Have you taken anything to assist with the matter?: Yes   If so, what did you take?: NSAIDS    Wanted to let you know I will be transferring you to further discuss your concern. Please be advised the nurse can assist with scheduling. >> Oct 26, 2023 10:09 AM Zane F wrote: In case call is disconnected callback number for the patient is [phone]  Reason for Disposition  [1] Redness AND [2] painful when touched AND [3] no fever  Answer Assessment - Initial Assessment Questions 1. LOCATION: Where is the swelling located?  (e.g., left, right, both knees)     Inner left knee 2. ONSET: When did the swelling start? Does it come and go, or is it there all the time?     X 4 weeks ago, worsening over the weekend 3. SWELLING: How bad is the swelling? Or, How large is it? (e.g., mild, moderate, severe; size of localized swelling)      Swelling increasing 4. PAIN: Is there any pain? If Yes, ask: How bad is it? (Scale 0-10; or none, mild, moderate, severe)     6/10 to touch 5. SETTING: Has there been any recent work,  exercise or other activity that involved that part of the body?      Previous diagnosis of thrombophlebitis  8. OTHER SYMPTOMS: Do you have any other symptoms? (e.g., calf pain, chest pain, difficulty breathing, fever)     Redness, warmth, feels more inflamed  Protocols used: Knee Swelling-A-AH

## 2023-10-27 ENCOUNTER — Ambulatory Visit

## 2023-10-27 ENCOUNTER — Ambulatory Visit: Payer: Self-pay | Admitting: Internal Medicine

## 2023-10-27 DIAGNOSIS — I8392 Asymptomatic varicose veins of left lower extremity: Secondary | ICD-10-CM | POA: Diagnosis not present

## 2023-10-27 DIAGNOSIS — I8002 Phlebitis and thrombophlebitis of superficial vessels of left lower extremity: Secondary | ICD-10-CM

## 2023-10-27 DIAGNOSIS — M79662 Pain in left lower leg: Secondary | ICD-10-CM | POA: Diagnosis not present

## 2023-11-08 ENCOUNTER — Encounter: Payer: Self-pay | Admitting: Internal Medicine

## 2023-11-11 ENCOUNTER — Encounter: Payer: Self-pay | Admitting: Internal Medicine

## 2023-11-11 ENCOUNTER — Other Ambulatory Visit: Payer: Self-pay

## 2023-11-11 ENCOUNTER — Other Ambulatory Visit (HOSPITAL_COMMUNITY): Payer: Self-pay

## 2023-11-17 ENCOUNTER — Other Ambulatory Visit: Payer: Self-pay

## 2023-11-18 ENCOUNTER — Other Ambulatory Visit (HOSPITAL_BASED_OUTPATIENT_CLINIC_OR_DEPARTMENT_OTHER): Payer: Self-pay

## 2023-11-18 MED ORDER — FLUZONE 0.5 ML IM SUSY
0.5000 mL | PREFILLED_SYRINGE | Freq: Once | INTRAMUSCULAR | 0 refills | Status: AC
  Filled 2023-11-18: qty 0.5, 1d supply, fill #0

## 2023-11-20 ENCOUNTER — Other Ambulatory Visit: Payer: Self-pay

## 2023-11-20 ENCOUNTER — Other Ambulatory Visit: Payer: Self-pay | Admitting: Urgent Care

## 2023-11-20 ENCOUNTER — Other Ambulatory Visit (HOSPITAL_COMMUNITY): Payer: Self-pay

## 2023-11-20 DIAGNOSIS — E782 Mixed hyperlipidemia: Secondary | ICD-10-CM

## 2023-11-20 MED ORDER — ROSUVASTATIN CALCIUM 10 MG PO TABS
10.0000 mg | ORAL_TABLET | Freq: Every day | ORAL | 1 refills | Status: DC
  Filled 2023-11-20: qty 90, 90d supply, fill #0
  Filled 2024-01-11: qty 90, 90d supply, fill #1
  Filled ????-??-??: fill #1

## 2023-11-20 NOTE — Telephone Encounter (Signed)
 Copied from CRM [id number]. Topic: Clinical - Medication Refill >> Nov 20, 2023  8:45 AM Carlatta H wrote: Medication: rosuvastatin  (CRESTOR ) 10 MG tablet  Has the patient contacted their pharmacy? No (Agent: If no, request that the patient contact the pharmacy for the refill. If patient does not wish to contact the pharmacy document the reason why and proceed with request.) (Agent: If yes, when and what did the pharmacy advise?)  This is the patient's preferred pharmacy:    [facility] [address] [place] [address] Phone: [phone] Fax: [phone]  Is this the correct pharmacy for this prescription? Yes If no, delete pharmacy and type the correct one.   Has the prescription been filled recently? No  Is the patient out of the medication? Yes  Has the patient been seen for an appointment in the last year OR does the patient have an upcoming appointment? Yes  Can we respond through MyChart? Yes  Agent: Please be advised that Rx refills may take up to 3 business days. We ask that you follow-up with your pharmacy.

## 2023-11-24 ENCOUNTER — Other Ambulatory Visit: Payer: Self-pay

## 2023-12-08 ENCOUNTER — Other Ambulatory Visit: Payer: Self-pay

## 2023-12-08 DIAGNOSIS — I83892 Varicose veins of left lower extremities with other complications: Secondary | ICD-10-CM

## 2023-12-15 ENCOUNTER — Other Ambulatory Visit (HOSPITAL_COMMUNITY): Payer: Self-pay

## 2023-12-18 ENCOUNTER — Other Ambulatory Visit (HOSPITAL_COMMUNITY): Payer: Self-pay

## 2024-01-05 NOTE — Progress Notes (Unsigned)
 [facility]           [facility] [place]  History and Physical   Angel Marshall is a 43 y.o. male who presents with varicose veins.  He was seen by his PCP 10/26/2023 and at that time, he was having some left leg swelling and had ~ 15 year hx of varicose veins.  He did wear compression but it led to the development of superficial thrombophlebitis on the medial portion of the left leg.  He had an u/s, which was negative for DVT.  He was started on Keflex  that did give him some improvement. He started wearing his compression again and the area worsened and he required two more rounds of Augmentin .  He states this improved without wearing compression (15-70mmHg knee high).    He states the above events happened about 5 months ago and mostly resolved.  He does not have any more issues.  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  He states at the end of the day, he does get a little achiness and some mild swelling.  He states that this improves after he has been in bed overnight.  He states his dad has hx of varicose veins. He does not have skin color changes on his legs. Overall, his legs are not bothersome to him.  He does not have any hx of DVT.    He was referred to [facility] for further evaluation.  The pt is on a statin for cholesterol management.  The pt is not on a daily aspirin.   Other AC:  none The pt is not on medication for hypertension.   The pt is not on medication for diabetes.   Tobacco hx:  never  Pt does not have family hx of AAA.  Past Medical History:  Diagnosis Date   GERD (gastroesophageal reflux disease)     Past Surgical History:  Procedure Laterality Date   APPENDECTOMY  09/2015   LAPAROSCOPIC APPENDECTOMY N/A 09/25/2015   Procedure: APPENDECTOMY LAPAROSCOPIC;  Surgeon: Debby Shipper, MD;  Location: [facility] OR;  Service: General;  Laterality: N/A;   UMBILECTOMY     UMBILICAL SURGERY      Social History    Socioeconomic History   Marital status: Married    Spouse name: Ronal Daring   Number of children: 3   Years of education: Not on file   Highest education level: Doctorate  Occupational History   Occupation: Printmaker: [employer]  Tobacco Use   Smoking status: Never   Smokeless tobacco: Never  Substance and Sexual Activity   Alcohol use: No    Alcohol/week: 0.0 standard drinks of alcohol   Drug use: No   Sexual activity: Not on file  Other Topics Concern   Not on file  Social History Narrative   Not on file   Social Drivers of Health   Financial Resource Strain: Not on file  Food Insecurity: Not on file  Transportation Needs: Not on file  Physical Activity: Not on file  Stress: Not on file  Social Connections: Not on file  Intimate Partner Violence: Not on file     Family History  Problem Relation Age of Onset   Hyperlipidemia Mother    Cancer Mother        Breast   Hyperlipidemia Father    Diabetes Neg Hx    Heart attack Neg Hx  Hypertension Neg Hx    Sudden death Neg Hx     Current Outpatient Medications  Medication Sig Dispense Refill   celecoxib  (CELEBREX ) 100 MG capsule Take 2 capsules (200 mg total) by mouth 2 (two) times daily. 120 capsule 2   ciclopirox  (PENLAC ) 8 % solution Apply topically at bedtime. Apply over nail and surrounding skin. Apply daily over previous coat. After seven (7) days, may remove with alcohol and continue cycle. 6.6 mL 4   famotidine  (PEPCID ) 20 MG tablet Take 2 tablets (40 mg total) by mouth 2 (two) times daily. 180 tablet 1   fluticasone  (FLONASE ) 50 MCG/ACT nasal spray PLACE 1 SPRAY INTO EACH NOSTRIL TWO TIMES DAILY. USE THE LEFT HAND FOR THE RIGHT NOSTRIL AND USE THE RIGHT HAND FOR THE LEFT NOSTRIL 48 g 3   omeprazole  (PRILOSEC) 10 MG capsule Take 4 capsules (40 mg total) by mouth daily. NEEDS APPOINTMENT FOR FURTHER REFILLS 360 capsule 3   rosuvastatin  (CRESTOR ) 10 MG tablet Take 1 tablet (10 mg total) by  mouth daily. 90 tablet 1   sodium fluoride  (FLUORISHIELD) 1.1 % GEL dental gel Brush once daily 100 mL 5   terbinafine  (LAMISIL ) 250 MG tablet Take 1 tablet (250 mg total) by mouth daily. 90 tablet 0   No current facility-administered medications for this visit.    No Known Allergies  REVIEW OF SYSTEMS:   [X]  denotes positive finding, [ ]  denotes negative finding Cardiac  Comments:  Chest pain or chest pressure:    Shortness of breath upon exertion:    Short of breath when lying flat:    Irregular heart rhythm:        Vascular    Pain in calf, thigh, or hip brought on by ambulation:    Pain in feet at night that wakes you up from your sleep:     Blood clot in your veins:    Leg swelling:  x       Pulmonary    Oxygen at home:    Productive cough:     Wheezing:         Neurologic    Sudden weakness in arms or legs:     Sudden numbness in arms or legs:     Sudden onset of difficulty speaking or slurred speech:    Temporary loss of vision in one eye:     Problems with dizziness:         Gastrointestinal    Blood in stool:     Vomited blood:         Genitourinary    Burning when urinating:     Blood in urine:        Psychiatric    Major depression:         Hematologic    Bleeding problems:    Problems with blood clotting too easily:        Skin    Rashes or ulcers:        Constitutional    Fever or chills:      PHYSICAL EXAMINATION:  Today's Vitals   01/06/24 1255  BP: 116/78  Pulse: 79  Temp: 97.7 F (36.5 C)  TempSrc: Temporal  Weight: 237 lb (107.5 kg)  PainSc: 0-No pain   Body mass index is 33.53 kg/m.   General:  WDWN in NAD; vital signs documented above Gait: Not observed HENT: WNL, normocephalic Pulmonary: normal non-labored breathing without wheezing Cardiac: regular HR; without carotid bruits Abdomen: soft, NT, aortic pulse  is not palpable Skin: without rashes Vascular Exam/Pulses:  Right Left  Radial 2+ (normal) 2+ (normal)  DP  2+ (normal) 2+ (normal)  PT 2+ (normal) 2+ (normal)   Extremities: varicosities present on bilateral legs.  Minimal swelling bilaterally.    Neurologic: A&O X 3;  moving all extremities equally Psychiatric:  The pt has Normal affect.   Non-Invasive Vascular Imaging:   Venous duplex on 01/06/2024: +-------------------+---------+------+-----------+------------+------------  ----+  LEFT              Reflux NoRefluxReflux TimeDiameter cmsComments                                  Yes                                           +-------------------+---------+------+-----------+------------+------------  CFV                         yes   >1 second                          +-------------------+---------+------+-----------+------------+------------  FV mid                       yes   >1 second                          +-------------------+---------+------+-----------+------------+------------  Popliteal                   yes   >1 second                          +-------------------+---------+------+-----------+------------+------------  GSV at SFJ                   yes    >500 ms      .71                  +-------------------+---------+------+-----------+------------+------------  GSV prox thigh                      >500 ms      .75                  +-------------------+---------+------+-----------+------------+------------  GSV mid thigh                yes    >500 ms      .61                  +-------------------+---------+------+-----------+------------+------------  GSV dist thigh               yes    >500 ms      .53                  +-------------------+---------+------+-----------+------------+------------  GSV at knee                  yes    >500 ms      1.29     chronic thrombus  +-------------------+---------+------+-----------+------------+------------  GSV prox calf      no                            .  52                    +-------------------+---------+------+-----------+------------+------------  GSV mid calf       no                            .43                  +-------------------+---------+------+-----------+------------+------------  GSV dist calf      no                            .29                  +-------------------+---------+------+-----------+------------+------------  SSV at [facility]                   yes    >500 ms      .65                  +-------------------+---------+------+-----------+------------+------------   SSV prox calf                yes    >500 ms      .53                  +-------------------+---------+------+-----------+------------+------------  SSV mid calf                                     .41                  +-------------------+---------+------+-----------+------------+------------   AAVS proximal thighno                            .88                  +-------------------+---------+------+-----------+------------+------------  Varicosities                yes    >500 ms      .38                  mid/distal thigh                                                      +-------------------+---------+------+-----------+------------+------------  Varicosities at              yes    >500 ms      .30                  knee                                                                  +-------------------+---------+------+-----------+------------+------------  Varicosities                yes    >500 ms    .64/.52    chronic thrombus proximal and mid calf                         +-------------------+---------+------+-----------+------------+------------  Summary:  Left:  - No evidence of deep vein thrombosis seen in the left lower extremity, from the common femoral through the popliteal veins.  - Color duplex evaluation of the left lower extremity shows there is thrombus in the greater saphenous vein at area  of the knee.   - Color duplex evaluation of the left lower extremity show there is thrombus in the varicosities in the proximal to distal calf.     LYRIQ JARCHOW is a 43 y.o. male who presents with: BLE varicose veins and hx of superficial venous thrombosis    -pt has easily palpable DP/PT pedal pulses bilaterally -in the left lower extremity, the pt does not have evidence of DVT.  Pt does have venous reflux in the deep venous system as well as the GSV at the [facility] and throughout the thigh to the knee as well as in the SSV.  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  Discussed if this is bothersome, he could use warm compresses on this area but this area has resolved.  His legs are asymptomatic from his varicose veins.  Discussed with him that if his symptoms progress and worsen, we would re-evaluate him for laser ablation.   -discussed with pt about wearing thigh high 20-30 mmHg compression stockings and pt was measured for these today.   Recommended thigh high given his hx with the knee high compression.   -discussed the importance of leg elevation and how to elevate properly - pt is advised to elevate their legs and a diagram is given to them to demonstrate for pt to lay flat on their back with knees elevated and slightly bent with their feet higher than their knees, which puts their feet higher than their heart for 15 minutes per day.  If pt cannot lay flat, advised to lay as flat as possible.  -pt is advised to continue as much walking as possible and avoid sitting or standing for long periods of time.  -discussed importance of maintaining a healthy weight and exercise and that water aerobics would also be beneficial.  -handout with recommendations given -pt will f/u as needed.    Lucie Apt, [facility] [phone]  Clinic MD:  Sheree

## 2024-01-06 ENCOUNTER — Ambulatory Visit (HOSPITAL_COMMUNITY)
Admission: RE | Admit: 2024-01-06 | Discharge: 2024-01-06 | Disposition: A | Discharge status: Home / Self Care | Source: Ambulatory Visit | Attending: Vascular Surgery | Admitting: Vascular Surgery

## 2024-01-06 ENCOUNTER — Ambulatory Visit: Admitting: Physician Assistant

## 2024-01-06 VITALS — BP 116/78 | HR 79 | Temp 97.7°F | Wt 237.0 lb

## 2024-01-06 DIAGNOSIS — I83892 Varicose veins of left lower extremities with other complications: Secondary | ICD-10-CM | POA: Insufficient documentation

## 2024-01-11 ENCOUNTER — Other Ambulatory Visit: Payer: Self-pay | Admitting: Urgent Care

## 2024-01-11 ENCOUNTER — Other Ambulatory Visit: Payer: Self-pay

## 2024-01-11 DIAGNOSIS — B351 Tinea unguium: Secondary | ICD-10-CM

## 2024-01-12 ENCOUNTER — Other Ambulatory Visit: Payer: Self-pay

## 2024-01-14 ENCOUNTER — Other Ambulatory Visit: Payer: Self-pay

## 2024-01-15 ENCOUNTER — Other Ambulatory Visit: Payer: Self-pay

## 2024-01-15 ENCOUNTER — Other Ambulatory Visit (HOSPITAL_COMMUNITY): Payer: Self-pay

## 2024-01-15 ENCOUNTER — Other Ambulatory Visit: Payer: Self-pay | Admitting: Urgent Care

## 2024-01-15 DIAGNOSIS — B351 Tinea unguium: Secondary | ICD-10-CM

## 2024-01-20 ENCOUNTER — Ambulatory Visit: Admitting: Urgent Care

## 2024-01-20 ENCOUNTER — Other Ambulatory Visit (HOSPITAL_BASED_OUTPATIENT_CLINIC_OR_DEPARTMENT_OTHER): Payer: Self-pay

## 2024-01-20 ENCOUNTER — Other Ambulatory Visit: Payer: Self-pay | Admitting: Urgent Care

## 2024-01-20 ENCOUNTER — Other Ambulatory Visit: Payer: Self-pay

## 2024-01-20 ENCOUNTER — Encounter: Payer: Self-pay | Admitting: Urgent Care

## 2024-01-20 VITALS — BP 124/81 | HR 77 | Ht 70.5 in | Wt 242.0 lb

## 2024-01-20 DIAGNOSIS — M51362 Other intervertebral disc degeneration, lumbar region with discogenic back pain and lower extremity pain: Secondary | ICD-10-CM

## 2024-01-20 DIAGNOSIS — Z23 Encounter for immunization: Secondary | ICD-10-CM | POA: Diagnosis not present

## 2024-01-20 DIAGNOSIS — B351 Tinea unguium: Secondary | ICD-10-CM | POA: Diagnosis not present

## 2024-01-20 DIAGNOSIS — K219 Gastro-esophageal reflux disease without esophagitis: Secondary | ICD-10-CM | POA: Diagnosis not present

## 2024-01-20 DIAGNOSIS — I83892 Varicose veins of left lower extremities with other complications: Secondary | ICD-10-CM | POA: Diagnosis not present

## 2024-01-20 DIAGNOSIS — Z0000 Encounter for general adult medical examination without abnormal findings: Secondary | ICD-10-CM | POA: Diagnosis not present

## 2024-01-20 DIAGNOSIS — E782 Mixed hyperlipidemia: Secondary | ICD-10-CM | POA: Diagnosis not present

## 2024-01-20 MED ORDER — METHOCARBAMOL 500 MG PO TABS
500.0000 mg | ORAL_TABLET | Freq: Three times a day (TID) | ORAL | 0 refills | Status: AC
  Filled 2024-01-20 – 2024-01-21 (×3): qty 90, 30d supply, fill #0

## 2024-01-20 MED ORDER — ROSUVASTATIN CALCIUM 10 MG PO TABS
10.0000 mg | ORAL_TABLET | Freq: Every day | ORAL | 1 refills | Status: AC
  Filled 2024-01-20 – 2024-02-15 (×2): qty 90, 90d supply, fill #0

## 2024-01-20 MED ORDER — FAMOTIDINE 20 MG PO TABS
40.0000 mg | ORAL_TABLET | Freq: Two times a day (BID) | ORAL | 1 refills | Status: AC
  Filled 2024-01-20: qty 180, 45d supply, fill #0

## 2024-01-20 MED ORDER — OMEPRAZOLE 10 MG PO CPDR
40.0000 mg | DELAYED_RELEASE_CAPSULE | Freq: Every day | ORAL | 3 refills | Status: AC
  Filled 2024-01-20: qty 360, 90d supply, fill #0

## 2024-01-20 NOTE — Patient Instructions (Addendum)
 We completed your annual physical today.  Look into [facility], they may be able to help you set your personal goals [email or web address]  I have refilled your medications. We updated your tetanus vaccine.  Please return annually sooner as needed.

## 2024-01-20 NOTE — Progress Notes (Signed)
 Annual Wellness Visit     Patient: Angel Marshall, Male    DOB: 02-16-1980, 43 y.o.   MRN: [id number]  Subjective  Chief Complaint  Patient presents with   Annual Exam    Low back pain x 3 days    Angel Marshall is a 43 y.o. male who presents today for his Annual Wellness Visit. He reports consuming a general diet. Goes to the gym four days a week - 2 days cardio, 2 days weight lifting. He generally feels fairly well. He reports sleeping well. He does have additional problems to discuss today.   HPI  Discussed the use of AI scribe software for clinical note transcription with the patient, who gave verbal consent to proceed.  History of Present Illness   Angel Marshall is a 43 year old male who presents for a routine physical exam and follow-up on varicose veins and superficial thrombophlebitis.  He has a history of superficial thrombophlebitis. He attributes some improvement to taking ibuprofen 2400 mg daily for about a month and a half. He is not significantly bothered by his varicose veins.  He has been using ciclopirox  (Penlac ) for a fungal infection, which he applied a few days ago. He previously tried multiple topical treatments, including Jublia , without success. He also used oral terbinafine  for at least six months but did not observe improvement. He is currently using ciclopirox .  He takes Celebrex  100 mg twice daily in theory but not consistently. He uses famotidine  twice daily and Prilosec once daily, although it is prescribed for twice daily use. He takes 10 mg of omeprazole  daily, spreading the doses to extend the medication supply. He is also on Crestor  10 mg daily.  He reports a recent back issue, describing it as a slipped or hit disc occurring three days ago, causing low back pain with radiating tingling down the legs. He has previously used methocarbamol  for similar issues, which he finds effective without affecting his ability to function.  He describes  his diet as having too many calories and insufficient protein, rating it a seven out of ten. He engages in physical activity four days a week, with two days focused on weight-bearing exercises and two days on cardio. He is dissatisfied with his weight, noting a recent increase to 230-235 pounds, which is above his comfort range of 195-200 pounds. He attributes this to reduced cardio activity and increased caloric intake.  He reports sleeping well, aided by an [employer] ring that helps him track his sleep. No significant ocular issues and no new moles or skin changes. He is fasting today for blood work.      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   Chronic right shoulder pain 03/02/2017   Lumbar degenerative disc disease 03/02/2017   Dysplastic nevus 03/02/2017   History of appendectomy 09/25/2015   Left knee pain 08/08/2015   Past Medical History:  Diagnosis Date   GERD (gastroesophageal reflux disease)    Past Surgical History:  Procedure Laterality Date   APPENDECTOMY  09/2015   LAPAROSCOPIC APPENDECTOMY N/A 09/25/2015   Procedure: APPENDECTOMY LAPAROSCOPIC;  Surgeon: Debby Shipper, MD;  Location: [facility] OR;  Service: General;  Laterality: N/A;   UMBILECTOMY     UMBILICAL SURGERY     Social History   Tobacco Use   Smoking status: Never   Smokeless tobacco: Never  Substance Use Topics   Alcohol use: No    Alcohol/week: 0.0 standard drinks of alcohol   Drug use: No       Medications: Outpatient Medications Prior to Visit  Medication Sig   celecoxib  (CELEBREX ) 100 MG capsule Take 2 capsules (200 mg total) by mouth 2 (two) times daily.   ciclopirox  (PENLAC ) 8 % solution Apply topically at bedtime. Apply over nail and surrounding skin. Apply daily over previous coat. After seven (7) days, may remove with alcohol and continue cycle.   fluticasone  (FLONASE ) 50 MCG/ACT nasal spray PLACE 1 SPRAY INTO EACH NOSTRIL TWO TIMES DAILY. USE THE LEFT HAND FOR THE RIGHT NOSTRIL AND USE THE RIGHT HAND FOR THE LEFT NOSTRIL   sodium fluoride  (FLUORISHIELD) 1.1 % GEL dental gel Brush once daily   [DISCONTINUED] famotidine  (PEPCID ) 20 MG tablet Take 2 tablets (40 mg total) by mouth 2 (two) times daily.   [DISCONTINUED] omeprazole  (PRILOSEC) 10 MG capsule Take 4 capsules (40 mg total) by mouth daily. NEEDS APPOINTMENT FOR FURTHER REFILLS   [DISCONTINUED] rosuvastatin  (CRESTOR ) 10 MG tablet Take 1 tablet (10 mg total) by mouth daily.   [DISCONTINUED] terbinafine  (LAMISIL ) 250 MG tablet Take 1 tablet (250 mg total) by mouth daily. (Patient not taking: Reported on 01/20/2024)   No facility-administered medications prior to visit.    No Known Allergies  Patient Care Team: Lowella Benton CROME, [place] as PCP - General (Physician Assistant)  ROS Complete 12 point ROS performed with all pertinent positives listed in HPI     Objective  BP 124/81   Pulse 77   Ht 5' 10.5 (1.791 m)   Wt 242 lb (109.8 kg)   SpO2 99%   BMI 34.23 kg/m  BP Readings from Last 3 Encounters:  01/20/24 124/81  01/06/24 116/78  10/26/23 120/82   Wt Readings from Last 3 Encounters:  01/20/24 242 lb (109.8 kg)  01/06/24 237 lb (107.5 kg)  10/26/23 233 lb (105.7 kg)      Physical Exam Vitals and nursing note reviewed. Exam conducted with a chaperone present.  Constitutional:      General: He is not in acute distress.    Appearance: Normal appearance. He is not ill-appearing, toxic-appearing or  diaphoretic.  HENT:     Head: Normocephalic and atraumatic.     Right Ear: Tympanic membrane, ear canal and external ear normal. There is no impacted cerumen.     Left Ear: Tympanic membrane, ear canal and external ear normal. There is no impacted cerumen.     Nose: Nose normal.     Mouth/Throat:     Mouth: Mucous membranes are moist.     Pharynx: Oropharynx is clear. No oropharyngeal exudate or posterior oropharyngeal erythema.  Eyes:     General: No scleral icterus.       Right eye: No discharge.        Left eye: No discharge.     Extraocular Movements: Extraocular movements intact.     Pupils: Pupils are equal, round, and reactive to light.  Neck:     Thyroid : No thyroid  mass, thyromegaly or thyroid  tenderness.  Cardiovascular:     Rate and Rhythm:  Normal rate and regular rhythm.     Pulses: Normal pulses.     Heart sounds: No murmur heard. Pulmonary:     Effort: Pulmonary effort is normal. No respiratory distress.     Breath sounds: Normal breath sounds. No stridor. No wheezing or rhonchi.  Abdominal:     General: Abdomen is flat. Bowel sounds are normal. There is no distension.     Palpations: Abdomen is soft. There is no mass.     Tenderness: There is no abdominal tenderness. There is no guarding.  Musculoskeletal:        General: No swelling. Normal range of motion.     Cervical back: Normal range of motion and neck supple. No rigidity or tenderness.     Right lower leg: No edema.     Left lower leg: No edema.       Feet:     Comments: Varicosities noted to LLE  Lymphadenopathy:     Cervical: No cervical adenopathy.  Skin:    General: Skin is warm and dry.     Coloration: Skin is not jaundiced.     Findings: No bruising, erythema or rash.  Neurological:     General: No focal deficit present.     Mental Status: He is alert and oriented to person, place, and time.     Sensory: No sensory deficit.     Motor: No weakness.     Gait: Gait normal.  Psychiatric:         Mood and Affect: Mood normal.        Behavior: Behavior normal.     Most recent depression screenings:    01/20/2024    9:02 AM 10/26/2023    3:46 PM  PHQ 2/9 Scores  PHQ - 2 Score 1 0  PHQ- 9 Score 2 0      Data saved with a previous flowsheet row definition      01/20/2024    9:02 AM 10/26/2023    3:46 PM 10/07/2023    8:28 AM 09/02/2022    2:37 PM 12/14/2019    9:37 AM  Depression screen PHQ 2/9  Decreased Interest 1 0 0 0 0  Down, Depressed, Hopeless 0 0 0 0 0  PHQ - 2 Score 1 0 0 0 0  Altered sleeping 0 0     Tired, decreased energy 1 0     Change in appetite 0 0     Feeling bad or failure about yourself  0 0     Trouble concentrating 0 0     Moving slowly or fidgety/restless 0 0     Suicidal thoughts 0 0     PHQ-9 Score 2 0      Difficult doing work/chores Not difficult at all Not difficult at all        Data saved with a previous flowsheet row definition     Fall Screening Falls in the past year?: 0 Number of falls in past year: 0 Was there an injury with Fall?: 0 Fall Risk Category Calculator: 0 Patient Fall Risk Level: Low Fall Risk  Fall Risk Patient at Risk for Falls Due to: No Fall Risks Fall risk Follow up: Falls evaluation completed    Vision/Hearing Screen: No results found.  Last CBC Lab Results  Component Value Date   WBC 6.2 10/07/2023   HGB 16.4 10/07/2023   HCT 48.2 10/07/2023   MCV 90 10/07/2023   MCH 30.5 10/07/2023   RDW 12.1 10/07/2023  PLT 207 10/07/2023   Last metabolic panel Lab Results  Component Value Date   GLUCOSE 88 10/07/2023   NA 139 10/07/2023   K 4.5 10/07/2023   CL 102 10/07/2023   CO2 23 10/07/2023   BUN 17 10/07/2023   CREATININE 1.05 10/07/2023   EGFR 90 10/07/2023   CALCIUM  9.4 10/07/2023   PROT 7.1 10/07/2023   ALBUMIN 4.4 10/07/2023   LABGLOB 2.7 10/07/2023   AGRATIO 1.8 06/15/2017   BILITOT 0.5 10/07/2023   ALKPHOS 80 10/07/2023   AST 38 10/07/2023   ALT 43 10/07/2023   ANIONGAP 8  09/24/2015   Last lipids Lab Results  Component Value Date   CHOL 122 10/07/2023   HDL 35 (L) 10/07/2023   LDLCALC 73 10/07/2023   TRIG 66 10/07/2023   CHOLHDL 3.5 10/07/2023   Last hemoglobin A1c Lab Results  Component Value Date   HGBA1C 5.4 10/07/2023   Last thyroid  functions Lab Results  Component Value Date   TSH 2.630 10/07/2023   FREET4 1.2 02/13/2021   Last vitamin D  Lab Results  Component Value Date   VD25OH 51 02/13/2021   Last vitamin B12 and Folate Lab Results  Component Value Date   VITAMINB12 786 02/13/2021   Nail Removal  Date/Time: 01/20/2024 8:50 AM  Performed by: Lowella Benton CROME, PA Authorized by: Lowella Benton CROME, PA   Consent:    Consent obtained:  Verbal   Consent given by:  Patient Location:    Foot:  L second toe Pre-procedure details:    Skin preparation:  Alcohol Anesthesia:    Anesthesia method:  None Nail Removal:    Nail removal amount: shave biopsy of L toenail.     No results found for any visits on 01/20/24.    Assessment & Plan   Annual wellness visit done today including the all of the following: Reviewed patient's Family and Medical History Reviewed and updated list of patient's medical providers Assessment of cognitive impairment was done Assessed patient's functional ability Established a written schedule for health screening services Health Risk Assessent Completed and Reviewed  Exercise Activities and Dietary recommendations  Goals   Weight loss down to 190-200#     Immunization History  Administered Date(s) Administered   Influenza Split 09/30/2015   Influenza, Seasonal, Injecte, Preservative Fre 10/29/2015, 11/07/2022, 11/18/2023   Influenza,inj,Quad PF,6+ Mos 11/23/2020, 11/22/2021   Influenza-Unspecified 09/10/2016, 11/25/2019   Moderna SARS-COV2 Booster Vaccination 12/20/2019   PFIZER(Purple Top)SARS-COV-2 Vaccination 02/08/2019, 03/09/2019   Tdap 01/27/2013, 01/20/2024    Health Maintenance   Topic Date Due   COVID-19 Vaccine (3 - Pfizer risk series) 02/05/2024 (Originally 01/17/2020)   Hepatitis B Vaccines 19-59 Average Risk (1 of 3 - 19+ 3-dose series) 01/19/2025 (Originally 12/30/1998)   DTaP/Tdap/Td (3 - Td or Tdap) 01/19/2034   Influenza Vaccine  Completed   Hepatitis C Screening  Completed   HIV Screening  Completed   Pneumococcal Vaccine  Aged Out   Meningococcal B Vaccine  Aged Out   HPV VACCINES  Discontinued     Discussed health benefits of physical activity, and encouraged him to engage in regular exercise appropriate for his age and condition.    Problem List Items Addressed This Visit     Lumbar degenerative disc disease   Relevant Medications   methocarbamol  (ROBAXIN ) 500 MG tablet   Mixed hyperlipidemia   Relevant Medications   rosuvastatin  (CRESTOR ) 10 MG tablet   GERD (gastroesophageal reflux disease)   Relevant Medications   famotidine  (PEPCID )  20 MG tablet   omeprazole  (PRILOSEC) 10 MG capsule   Onychomycosis   Relevant Orders   Surgical pathology   Other Visit Diagnoses       Routine adult health maintenance    -  Primary   Relevant Orders   CBC with Differential/Platelet   Hemoglobin A1c   TSH   Lipid panel   Comprehensive metabolic panel with GFR   PSA     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 Diet lacks protein, calorie-dense. Adequate sleep. - Administered tetanus vaccine. - Discussed Nourish program for dietary coaching. - Encouraged protein intake and calorie tracking. - Continue current exercise regimen. - Monitor vision changes, consider over-the-counter readers.  Onychomycosis Previous ciclopirox  and terbinafine  ineffective. Terbinafine   discontinued due to inefficacy and liver toxicity risk. - Discontinued terbinafine . - shave bx performed of L 2nd toenail  Varicose veins of left lower extremity with complications Varicose veins present, not bothersome. Previous thrombophlebitis resolved.  Degeneration of intervertebral disc of lumbar region with discogenic back pain and lower extremity pain Recent low back pain with leg tingling, likely muscular. Improved with mobility. Methocarbamol  effective. - Prescribed methocarbamol  for muscle relaxation.  Gastroesophageal reflux disease Symptoms controlled with omeprazole  and famotidine . Current dosing preferred for cost-effectiveness. - Continue omeprazole  and famotidine .  Mixed hyperlipidemia Managed with Crestor  10 mg daily, no issues. - Continue Crestor  10 mg daily.        Return in about 1 year (around 01/19/2025) for Annual Physical.     Benton LITTIE Gave, PA

## 2024-01-21 ENCOUNTER — Other Ambulatory Visit (HOSPITAL_BASED_OUTPATIENT_CLINIC_OR_DEPARTMENT_OTHER): Payer: Self-pay

## 2024-01-21 ENCOUNTER — Other Ambulatory Visit: Payer: Self-pay

## 2024-01-21 ENCOUNTER — Ambulatory Visit: Payer: Self-pay | Admitting: Urgent Care

## 2024-01-21 LAB — CBC WITH DIFFERENTIAL/PLATELET
Basophils Absolute: 0.1 x10E3/uL (ref 0.0–0.2)
Basos: 1 %
EOS (ABSOLUTE): 0.3 x10E3/uL (ref 0.0–0.4)
Eos: 4 %
Hematocrit: 50.1 % (ref 37.5–51.0)
Hemoglobin: 16.7 g/dL (ref 13.0–17.7)
Immature Grans (Abs): 0 x10E3/uL (ref 0.0–0.1)
Immature Granulocytes: 0 %
Lymphocytes Absolute: 2 x10E3/uL (ref 0.7–3.1)
Lymphs: 29 %
MCH: 29.8 pg (ref 26.6–33.0)
MCHC: 33.3 g/dL (ref 31.5–35.7)
MCV: 89 fL (ref 79–97)
Monocytes Absolute: 0.6 x10E3/uL (ref 0.1–0.9)
Monocytes: 8 %
Neutrophils Absolute: 3.8 x10E3/uL (ref 1.4–7.0)
Neutrophils: 58 %
Platelets: 222 x10E3/uL (ref 150–450)
RBC: 5.61 x10E6/uL (ref 4.14–5.80)
RDW: 12.2 % (ref 11.6–15.4)
WBC: 6.7 x10E3/uL (ref 3.4–10.8)

## 2024-01-21 LAB — COMPREHENSIVE METABOLIC PANEL WITH GFR
ALT: 41 IU/L (ref 0–44)
AST: 35 IU/L (ref 0–40)
Albumin: 4.5 g/dL (ref 4.1–5.1)
Alkaline Phosphatase: 82 IU/L (ref 47–123)
BUN/Creatinine Ratio: 9 (ref 9–20)
BUN: 12 mg/dL (ref 6–24)
Bilirubin Total: 0.6 mg/dL (ref 0.0–1.2)
CO2: 25 mmol/L (ref 20–29)
Calcium: 9.9 mg/dL (ref 8.7–10.2)
Chloride: 102 mmol/L (ref 96–106)
Creatinine, Ser: 1.32 mg/dL — ABNORMAL HIGH (ref 0.76–1.27)
Globulin, Total: 2.4 g/dL (ref 1.5–4.5)
Glucose: 84 mg/dL (ref 70–99)
Potassium: 4.8 mmol/L (ref 3.5–5.2)
Sodium: 141 mmol/L (ref 134–144)
Total Protein: 6.9 g/dL (ref 6.0–8.5)
eGFR: 68 mL/min/1.73 (ref >59)

## 2024-01-21 LAB — PSA: Prostate Specific Ag, Serum: 0.6 ng/mL (ref 0.0–4.0)

## 2024-01-21 LAB — LIPID PANEL
Chol/HDL Ratio: 3.2 ratio (ref 0.0–5.0)
Cholesterol, Total: 130 mg/dL (ref 100–199)
HDL: 41 mg/dL (ref >39)
LDL Chol Calc (NIH): 75 mg/dL (ref 0–99)
Triglycerides: 69 mg/dL (ref 0–149)
VLDL Cholesterol Cal: 14 mg/dL (ref 5–40)

## 2024-01-21 LAB — HEMOGLOBIN A1C
Est. average glucose Bld gHb Est-mCnc: 105 mg/dL
Hgb A1c MFr Bld: 5.3 % (ref 4.8–5.6)

## 2024-01-21 LAB — TSH: TSH: 2.65 u[IU]/mL (ref 0.450–4.500)

## 2024-01-22 LAB — DERMATOLOGY PATHOLOGY

## 2024-01-25 ENCOUNTER — Ambulatory Visit: Payer: Self-pay | Admitting: Urgent Care

## 2024-01-25 DIAGNOSIS — L609 Nail disorder, unspecified: Secondary | ICD-10-CM

## 2024-01-27 ENCOUNTER — Ambulatory Visit: Admitting: Podiatry

## 2024-01-27 ENCOUNTER — Encounter: Payer: Self-pay | Admitting: Podiatry

## 2024-01-27 DIAGNOSIS — B351 Tinea unguium: Secondary | ICD-10-CM | POA: Diagnosis not present

## 2024-01-27 NOTE — Progress Notes (Signed)
°  Subjective:  Patient ID: Angel Marshall, male    DOB: 21-Oct-1980,   MRN: [id number]  Chief Complaint  Patient presents with   Nail Problem    Toenail may or may not be fungus.  I've done labs that have stated so.  I've taken Terbinafine , two rounds and I've done topicals, Jublia  and Penlac  x 2.    43 y.o. male presents for concern of ongoing nails that has been ongoing for about 10 years.  He has had the nails cultured in the past and has been on terbinafine  Jublia  and Penlac  x 2.  Relates they continue to be a problem.  He has not had the nails cultured in the past.. Denies any other pedal complaints. Denies n/v/f/c.   Past Medical History:  Diagnosis Date   GERD (gastroesophageal reflux disease)     Objective:  Physical Exam: Vascular: DP/PT pulses 2/4 bilateral. CFT <3 seconds. Normal hair growth on digits. No edema.  Skin. No lacerations or abrasions bilateral feet.  Nails 2 and 3 bilateral are thickened dystrophic with subungual debris. Musculoskeletal: MMT 5/5 bilateral lower extremities in DF, PF, Inversion and Eversion. Deceased ROM in DF of ankle joint.  Neurological: Sensation intact to light touch.   Assessment:   1. Onychomycosis      Plan:  Patient was evaluated and treated and all questions answered. -Examined patient -Discussed treatment options for painful dystrophic nails  -Clinical picture and Fungal culture was obtained by removing a portion of the hard nail itself from each of the involved toenails using a sterile nail nipper and sent to [facility] lab. Patient tolerated the biopsy procedure well without discomfort or need for anesthesia.  -Discussed fungal nail treatment options including oral, topical, and laser treatments.  -Patient to return in 4 weeks for follow up evaluation and discussion of fungal culture results or sooner if symptoms worsen.   Asberry Failing, DPM

## 2024-02-01 ENCOUNTER — Other Ambulatory Visit (HOSPITAL_BASED_OUTPATIENT_CLINIC_OR_DEPARTMENT_OTHER): Payer: Self-pay

## 2024-02-01 ENCOUNTER — Telehealth: Admitting: Physician Assistant

## 2024-02-01 DIAGNOSIS — J069 Acute upper respiratory infection, unspecified: Secondary | ICD-10-CM

## 2024-02-01 MED ORDER — PROMETHAZINE-DM 6.25-15 MG/5ML PO SYRP
5.0000 mL | ORAL_SOLUTION | Freq: Four times a day (QID) | ORAL | 0 refills | Status: AC | PRN
  Filled 2024-02-01: qty 118, 6d supply, fill #0

## 2024-02-01 NOTE — Progress Notes (Signed)
 We are sorry you are not feeling well.  Here is how we plan to help!  Based on what you have shared with me, it looks like you may have a viral upper respiratory infection.  Upper respiratory infections are caused by a large number of viruses; however, rhinovirus is the most common cause. I recommend you get an OTC COVID/Flu test to take at home as a precautions. If positive for either, please let us  know.   Symptoms vary from person to person, with common symptoms including sore throat, cough, and fatigue or lack of energy, and a feeling of general discomfort.  A low-grade fever of up to 100.4 may present, but is often uncommon.  Symptoms vary however, and are closely related to a person's age or underlying illnesses.  The most common symptoms associated with an upper respiratory infection are nasal discharge or congestion, cough, sneezing, headache and pressure in the ears and face.  These symptoms usually persist for about 3 to 10 days, but can last up to 2 weeks.  It is important to know that upper respiratory infections do not cause serious illness or complications in most cases.    Upper respiratory infections can be transmitted from person to person, with the most common method of transmission being a person's hands.  The virus is able to live on the skin and can infect other persons for up to 2 hours after direct contact.  Also, these can be transmitted when someone coughs or sneezes; thus, it is important to cover the mouth to reduce this risk.  To keep the spread of the illness at bay, good hand hygiene is very important!  Because this is a viral infection, there are no specific treatments other than to help you with the symptoms until the infection runs its course.    For nasal congestion, you may use an oral decongestants such as Mucinex D or if you have glaucoma or high blood pressure use plain Mucinex.  Saline nasal spray or nasal drops can help and can safely be used as often as needed for  congestion.    If you do not have a history of heart disease, hypertension, diabetes or thyroid  disease, prostate/bladder issues or glaucoma, you may also use Sudafed to treat nasal congestion.  It is highly recommended that you consult with a pharmacist or your primary care physician to ensure this medication is safe for you to take.     If you have a cough, you may use over-the-counter cough suppressants such as Delsym and Robitussin.  If you have glaucoma or high blood pressure, you can also use Coricidin HBP.   For cough I have prescribed for you A prescription cough medication called Promethazine -DM 6.25-15 mg/5 mL. Take 5 mL every 6 hours as needed for cough.  If you have a sore or scratchy throat, use a saltwater gargle-  to  teaspoon of salt dissolved in a 4-ounce to 8-ounce glass of warm water.  Gargle the solution for approximately 15-30 seconds and then spit.  It is important not to swallow the solution.  You can also use throat lozenges/cough drops and Chloraseptic spray to help with throat pain or discomfort.  Warm or cold liquids can also be helpful in relieving throat pain.   For headache, pain, or general discomfort, you can use Ibuprofen or Tylenol  as directed.   Some authorities believe that zinc sprays or the use of Echinacea may shorten the course of your symptoms.   HOME CARE Only  take medications as instructed by your medical team. Be sure to drink plenty of fluids. Water is fine as well as fruit juices, sodas and electrolyte beverages. You may want to stay away from caffeine or alcohol. If you are nauseated, try taking small sips of liquids. How do you know if you are getting enough fluid? Your urine should be a pale yellow or almost colorless. Get rest. Taking a steamy shower or using a humidifier may help nasal congestion and ease sore throat pain. You can place a towel over your head and breathe in the steam from hot water coming from a faucet. Using a saline nasal spray  works much the same way. Cough drops, hard candies and sore throat lozenges may ease your cough. Avoid close contacts especially the very young and the elderly Cover your mouth if you cough or sneeze Always remember to wash your hands.   GET HELP RIGHT AWAY IF: You develop worsening fever. If your symptoms do not improve within 10 days You develop yellow or green discharge from your nose over 3 days. You have coughing fits You develop a severe head ache or visual changes. You develop shortness of breath, difficulty breathing or start having chest pain Your symptoms persist after you have completed your treatment plan  MAKE SURE YOU  Understand these instructions. Will watch your condition. Will get help right away if you are not doing well or get worse.  Your e-visit answers were reviewed by a board certified advanced clinical practitioner to complete your personal care plan. Depending upon the condition, your plan could have included both over-the-counter or prescription medications.  Please review your pharmacy choice. If there is a problem, you may call our nursing hot line at and have the prescription routed to another pharmacy. Your safety is important to us . If you have drug allergies, check your prescription carefully.   You can use MyChart to ask questions about todays visit, request a non-urgent call back, or ask for a work or school excuse for 24 hours related to this e-Visit. If it has been greater than 24 hours you will need to follow up with your provider, or enter a new e-Visit to address those concerns. You will get an e-mail in the next two days asking about your experience.  I hope that your e-visit has been valuable and will speed your recovery. Thank you for using e-visits.   I have spent 5 minutes in review of e-visit questionnaire, review and updating patient chart, medical decision making and response to patient.   Elsie Velma Lunger, PA-C

## 2024-02-12 ENCOUNTER — Other Ambulatory Visit (HOSPITAL_BASED_OUTPATIENT_CLINIC_OR_DEPARTMENT_OTHER): Payer: Self-pay

## 2024-02-15 ENCOUNTER — Other Ambulatory Visit: Payer: Self-pay

## 2024-02-24 ENCOUNTER — Ambulatory Visit: Admitting: Podiatry
